# Patient Record
Sex: Female | Born: 1990 | Race: Black or African American | Hispanic: No | Marital: Single | Smoking: Current some day smoker
Health system: Southern US, Community
[De-identification: ages and names within clinical notes are randomized; demographics above are authoritative.]

## PROBLEM LIST (undated history)

## (undated) ENCOUNTER — Inpatient Hospital Stay (HOSPITAL_COMMUNITY): Payer: Self-pay

## (undated) DIAGNOSIS — R519 Headache, unspecified: Secondary | ICD-10-CM

## (undated) DIAGNOSIS — F329 Major depressive disorder, single episode, unspecified: Secondary | ICD-10-CM

## (undated) DIAGNOSIS — R87629 Unspecified abnormal cytological findings in specimens from vagina: Secondary | ICD-10-CM

## (undated) DIAGNOSIS — F32A Depression, unspecified: Secondary | ICD-10-CM

## (undated) DIAGNOSIS — O2340 Unspecified infection of urinary tract in pregnancy, unspecified trimester: Secondary | ICD-10-CM

## (undated) HISTORY — DX: Headache, unspecified: R51.9

## (undated) HISTORY — PX: NO PAST SURGERIES: SHX2092

## (undated) HISTORY — DX: Unspecified infection of urinary tract in pregnancy, unspecified trimester: O23.40

## (undated) HISTORY — DX: Unspecified abnormal cytological findings in specimens from vagina: R87.629

---
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
## 2009-05-04 ENCOUNTER — Emergency Department (HOSPITAL_COMMUNITY): Admission: EM | Admit: 2009-05-04 | Discharge: 2009-05-04 | Payer: Self-pay | Admitting: Emergency Medicine

## 2009-05-15 ENCOUNTER — Ambulatory Visit: Payer: Self-pay | Admitting: Family

## 2009-05-15 ENCOUNTER — Inpatient Hospital Stay (HOSPITAL_COMMUNITY): Admission: AD | Admit: 2009-05-15 | Discharge: 2009-05-15 | Payer: Self-pay | Admitting: Obstetrics and Gynecology

## 2009-05-23 ENCOUNTER — Ambulatory Visit: Payer: Self-pay | Admitting: Obstetrics and Gynecology

## 2009-05-23 ENCOUNTER — Encounter: Payer: Self-pay | Admitting: Physician Assistant

## 2009-05-23 LAB — CONVERTED CEMR LAB
Hgb A: 97.3 % (ref 96.8–97.8)
Hgb S Quant: 0 % (ref 0.0–0.0)

## 2009-06-14 ENCOUNTER — Ambulatory Visit: Payer: Self-pay | Admitting: Obstetrics & Gynecology

## 2009-06-28 ENCOUNTER — Encounter: Payer: Self-pay | Admitting: Obstetrics & Gynecology

## 2009-06-28 ENCOUNTER — Ambulatory Visit: Payer: Self-pay | Admitting: Obstetrics and Gynecology

## 2009-06-29 ENCOUNTER — Encounter: Payer: Self-pay | Admitting: Obstetrics & Gynecology

## 2009-06-29 LAB — CONVERTED CEMR LAB
Chlamydia, DNA Probe: NEGATIVE
GC Probe Amp, Genital: NEGATIVE

## 2009-07-05 ENCOUNTER — Ambulatory Visit: Payer: Self-pay | Admitting: Obstetrics & Gynecology

## 2009-07-06 ENCOUNTER — Ambulatory Visit: Payer: Self-pay | Admitting: Family Medicine

## 2009-07-06 ENCOUNTER — Inpatient Hospital Stay (HOSPITAL_COMMUNITY): Admission: AD | Admit: 2009-07-06 | Discharge: 2009-07-06 | Payer: Self-pay | Admitting: Obstetrics & Gynecology

## 2009-07-12 ENCOUNTER — Encounter: Payer: Self-pay | Admitting: Obstetrics & Gynecology

## 2009-07-12 ENCOUNTER — Ambulatory Visit: Payer: Self-pay | Admitting: Obstetrics and Gynecology

## 2009-07-19 ENCOUNTER — Ambulatory Visit: Payer: Self-pay | Admitting: Family Medicine

## 2009-07-20 ENCOUNTER — Inpatient Hospital Stay (HOSPITAL_COMMUNITY): Admission: AD | Admit: 2009-07-20 | Discharge: 2009-07-20 | Payer: Self-pay | Admitting: Obstetrics & Gynecology

## 2009-07-20 ENCOUNTER — Ambulatory Visit: Payer: Self-pay | Admitting: Advanced Practice Midwife

## 2009-07-23 ENCOUNTER — Ambulatory Visit: Payer: Self-pay | Admitting: Obstetrics and Gynecology

## 2009-07-23 ENCOUNTER — Inpatient Hospital Stay (HOSPITAL_COMMUNITY): Admission: AD | Admit: 2009-07-23 | Discharge: 2009-07-25 | Payer: Self-pay | Admitting: Obstetrics & Gynecology

## 2009-08-23 ENCOUNTER — Ambulatory Visit: Payer: Self-pay | Admitting: Obstetrics & Gynecology

## 2009-11-12 ENCOUNTER — Emergency Department (HOSPITAL_COMMUNITY): Admission: EM | Admit: 2009-11-12 | Discharge: 2009-11-12 | Payer: Self-pay | Admitting: Emergency Medicine

## 2010-05-23 LAB — URINE CULTURE
Colony Count: 25000
Culture  Setup Time: 201109060212

## 2010-05-23 LAB — URINE MICROSCOPIC-ADD ON

## 2010-05-23 LAB — URINALYSIS, ROUTINE W REFLEX MICROSCOPIC
Ketones, ur: NEGATIVE mg/dL
Nitrite: NEGATIVE
Protein, ur: NEGATIVE mg/dL

## 2010-05-27 LAB — CBC
HCT: 34.8 % — ABNORMAL LOW (ref 36.0–46.0)
Hemoglobin: 11.6 g/dL — ABNORMAL LOW (ref 12.0–15.0)
MCHC: 33.3 g/dL (ref 30.0–36.0)
MCV: 85.7 fL (ref 78.0–100.0)
Platelets: 268 K/uL (ref 150–400)
RBC: 4.06 MIL/uL (ref 3.87–5.11)
RDW: 14.1 % (ref 11.5–15.5)
WBC: 9.1 K/uL (ref 4.0–10.5)

## 2010-05-27 LAB — URINALYSIS, ROUTINE W REFLEX MICROSCOPIC
Glucose, UA: NEGATIVE mg/dL
Hgb urine dipstick: NEGATIVE
Ketones, ur: 15 mg/dL — AB
Protein, ur: NEGATIVE mg/dL
Specific Gravity, Urine: >1.03 — ABNORMAL HIGH (ref 1.005–1.030)
Urobilinogen, UA: 1 mg/dL (ref 0.0–1.0)
pH: 6 (ref 5.0–8.0)

## 2010-05-27 LAB — RAPID URINE DRUG SCREEN, HOSP PERFORMED: Cocaine: NOT DETECTED

## 2010-05-28 LAB — POCT URINALYSIS DIP (DEVICE)
Bilirubin Urine: NEGATIVE
Bilirubin Urine: NEGATIVE
Glucose, UA: NEGATIVE mg/dL
Hgb urine dipstick: NEGATIVE
Hgb urine dipstick: NEGATIVE
Hgb urine dipstick: NEGATIVE
Ketones, ur: NEGATIVE mg/dL
Ketones, ur: NEGATIVE mg/dL
Nitrite: NEGATIVE
Protein, ur: 30 mg/dL — AB
Protein, ur: 30 mg/dL — AB
Specific Gravity, Urine: 1.02 (ref 1.005–1.030)
Specific Gravity, Urine: >=1.03 (ref 1.005–1.030)
Urobilinogen, UA: 1 mg/dL (ref 0.0–1.0)
Urobilinogen, UA: 1 mg/dL (ref 0.0–1.0)
Urobilinogen, UA: 2 mg/dL — ABNORMAL HIGH (ref 0.0–1.0)
pH: 7 (ref 5.0–8.0)
pH: 7.5 (ref 5.0–8.0)

## 2010-05-29 LAB — POCT URINALYSIS DIP (DEVICE)
Hgb urine dipstick: NEGATIVE
Ketones, ur: NEGATIVE mg/dL
Urobilinogen, UA: 0.2 mg/dL (ref 0.0–1.0)
pH: 5.5 (ref 5.0–8.0)

## 2010-06-03 LAB — POCT URINALYSIS DIP (DEVICE)
Ketones, ur: NEGATIVE mg/dL
Nitrite: NEGATIVE
Specific Gravity, Urine: >=1.03 (ref 1.005–1.030)
pH: 6 (ref 5.0–8.0)

## 2010-06-03 LAB — URINE CULTURE

## 2010-06-03 LAB — CBC
HCT: 37.9 % (ref 36.0–46.0)
Hemoglobin: 12.5 g/dL (ref 12.0–15.0)
MCV: 90.3 fL (ref 78.0–100.0)
Platelets: 278 10*3/uL (ref 150–400)
RBC: 4.2 MIL/uL (ref 3.87–5.11)
WBC: 6.9 10*3/uL (ref 4.0–10.5)

## 2010-06-03 LAB — GLUCOSE, CAPILLARY: Glucose-Capillary: 48 mg/dL — ABNORMAL LOW (ref 70–99)

## 2010-06-03 LAB — URINALYSIS, ROUTINE W REFLEX MICROSCOPIC
Glucose, UA: NEGATIVE mg/dL
Nitrite: NEGATIVE
Specific Gravity, Urine: 1.01 (ref 1.005–1.030)
pH: 7 (ref 5.0–8.0)

## 2010-06-03 LAB — DIFFERENTIAL
Basophils Absolute: 0 10*3/uL (ref 0.0–0.1)
Basophils Relative: 0 % (ref 0–1)
Eosinophils Relative: 4 % (ref 0–5)

## 2010-06-03 LAB — URINE MICROSCOPIC-ADD ON

## 2010-06-03 LAB — WET PREP, GENITAL
Clue Cells Wet Prep HPF POC: NONE SEEN
Yeast Wet Prep HPF POC: NONE SEEN

## 2010-06-03 LAB — GC/CHLAMYDIA PROBE AMP, GENITAL: GC Probe Amp, Genital: NEGATIVE

## 2010-06-03 LAB — HEPATITIS B SURFACE ANTIGEN: Hepatitis B Surface Ag: NEGATIVE

## 2010-06-14 LAB — URINALYSIS, ROUTINE W REFLEX MICROSCOPIC
Glucose, UA: NEGATIVE mg/dL
Ketones, ur: 15 mg/dL — AB
Protein, ur: NEGATIVE mg/dL
Urobilinogen, UA: 1 mg/dL (ref 0.0–1.0)

## 2010-06-14 LAB — URINE CULTURE

## 2010-06-14 LAB — URINE MICROSCOPIC-ADD ON

## 2010-06-14 LAB — BASIC METABOLIC PANEL
Calcium: 9.2 mg/dL (ref 8.4–10.5)
Chloride: 107 mEq/L (ref 96–112)
Creatinine, Ser: 0.62 mg/dL (ref 0.4–1.2)
GFR calc Af Amer: >60 mL/min (ref >60)

## 2010-06-14 LAB — DIFFERENTIAL
Lymphocytes Relative: 12 % (ref 12–46)
Lymphs Abs: 1.3 10*3/uL (ref 0.7–4.0)
Monocytes Relative: 4 % (ref 3–12)
Neutro Abs: 9.6 10*3/uL — ABNORMAL HIGH (ref 1.7–7.7)
Neutrophils Relative %: 84 % — ABNORMAL HIGH (ref 43–77)

## 2010-06-14 LAB — CBC
RBC: 4.68 MIL/uL (ref 3.87–5.11)
WBC: 11.5 10*3/uL — ABNORMAL HIGH (ref 4.0–10.5)

## 2010-06-14 LAB — GC/CHLAMYDIA PROBE AMP, GENITAL
Chlamydia, DNA Probe: POSITIVE — AB
GC Probe Amp, Genital: NEGATIVE

## 2010-06-14 LAB — ABO/RH: ABO/RH(D): A POS

## 2010-06-16 LAB — CBC
HCT: 37 % (ref 36.0–46.0)
Hemoglobin: 13 g/dL (ref 12.0–15.0)
MCHC: 34.6 g/dL (ref 30.0–36.0)
MCHC: 35.2 g/dL (ref 30.0–36.0)
MCV: 91 fL (ref 78.0–100.0)
RBC: 3.63 MIL/uL — ABNORMAL LOW (ref 3.87–5.11)
RDW: 13.4 % (ref 11.5–15.5)

## 2010-06-16 LAB — CCBB MATERNAL DONOR DRAW

## 2010-06-19 LAB — WET PREP, GENITAL

## 2010-07-08 ENCOUNTER — Inpatient Hospital Stay (INDEPENDENT_AMBULATORY_CARE_PROVIDER_SITE_OTHER)
Admission: RE | Admit: 2010-07-08 | Discharge: 2010-07-08 | Disposition: A | Discharge status: Home / Self Care | Payer: Medicaid Other | Source: Ambulatory Visit | Attending: Family Medicine | Admitting: Family Medicine

## 2010-07-08 DIAGNOSIS — N390 Urinary tract infection, site not specified: Secondary | ICD-10-CM

## 2010-07-08 LAB — POCT URINALYSIS DIP (DEVICE)
Nitrite: POSITIVE — AB
Protein, ur: 100 mg/dL — AB

## 2010-07-08 LAB — POCT PREGNANCY, URINE: Preg Test, Ur: NEGATIVE

## 2010-07-09 LAB — GC/CHLAMYDIA PROBE AMP, URINE
Chlamydia, Swab/Urine, PCR: POSITIVE — AB
GC Probe Amp, Urine: NEGATIVE

## 2010-07-10 LAB — URINE CULTURE: Culture  Setup Time: 201204301530

## 2010-10-05 ENCOUNTER — Emergency Department (HOSPITAL_COMMUNITY)
Admission: EM | Admit: 2010-10-05 | Discharge: 2010-10-05 | Disposition: A | Discharge status: Home / Self Care | Payer: Medicaid Other | Attending: Emergency Medicine | Admitting: Emergency Medicine

## 2010-10-05 DIAGNOSIS — R55 Syncope and collapse: Secondary | ICD-10-CM | POA: Insufficient documentation

## 2010-10-05 DIAGNOSIS — R110 Nausea: Secondary | ICD-10-CM | POA: Insufficient documentation

## 2010-10-05 LAB — POCT I-STAT, CHEM 8
Creatinine, Ser: 0.8 mg/dL (ref 0.50–1.10)
HCT: 41 % (ref 36.0–46.0)
Hemoglobin: 13.9 g/dL (ref 12.0–15.0)
Potassium: 3.7 mEq/L (ref 3.5–5.1)
Sodium: 141 mEq/L (ref 135–145)

## 2010-10-05 LAB — HCG, SERUM, QUALITATIVE: Preg, Serum: NEGATIVE

## 2010-12-02 LAB — WET PREP, GENITAL: Clue Cells Wet Prep HPF POC: NONE SEEN

## 2010-12-02 LAB — POCT URINALYSIS DIP (DEVICE)
Glucose, UA: NEGATIVE
Hgb urine dipstick: NEGATIVE
Nitrite: NEGATIVE
Operator id: 116491
Specific Gravity, Urine: 1.025
Urobilinogen, UA: 1

## 2010-12-02 LAB — URINALYSIS, ROUTINE W REFLEX MICROSCOPIC
Ketones, ur: NEGATIVE
Nitrite: NEGATIVE
Protein, ur: NEGATIVE
Urobilinogen, UA: 1

## 2010-12-02 LAB — URINE MICROSCOPIC-ADD ON

## 2010-12-02 LAB — GC/CHLAMYDIA PROBE AMP, GENITAL: Chlamydia, DNA Probe: POSITIVE — AB

## 2010-12-02 LAB — URINE CULTURE: Colony Count: 80000

## 2010-12-06 LAB — CULTURE, ROUTINE-ABSCESS

## 2010-12-13 LAB — HCG, QUANTITATIVE, PREGNANCY: hCG, Beta Chain, Quant, S: 35387 m[IU]/mL — ABNORMAL HIGH (ref <5)

## 2010-12-13 LAB — URINALYSIS, ROUTINE W REFLEX MICROSCOPIC
Bilirubin Urine: NEGATIVE
Glucose, UA: NEGATIVE mg/dL
Hgb urine dipstick: NEGATIVE
Specific Gravity, Urine: 1.022 (ref 1.005–1.030)
Urobilinogen, UA: 0.2 mg/dL (ref 0.0–1.0)

## 2010-12-13 LAB — URINE MICROSCOPIC-ADD ON

## 2011-02-08 ENCOUNTER — Emergency Department (HOSPITAL_COMMUNITY)
Admission: EM | Admit: 2011-02-08 | Discharge: 2011-02-09 | Disposition: A | Discharge status: Home / Self Care | Payer: Medicaid Other | Attending: Emergency Medicine | Admitting: Emergency Medicine

## 2011-02-08 ENCOUNTER — Encounter: Payer: Self-pay | Admitting: *Deleted

## 2011-02-08 DIAGNOSIS — R109 Unspecified abdominal pain: Secondary | ICD-10-CM | POA: Insufficient documentation

## 2011-02-08 DIAGNOSIS — A64 Unspecified sexually transmitted disease: Secondary | ICD-10-CM

## 2011-02-08 DIAGNOSIS — N898 Other specified noninflammatory disorders of vagina: Secondary | ICD-10-CM | POA: Insufficient documentation

## 2011-02-08 DIAGNOSIS — R110 Nausea: Secondary | ICD-10-CM | POA: Insufficient documentation

## 2011-02-08 NOTE — ED Notes (Signed)
Patient with abdominal pain for about three weeks.  Pain is located on her low abdominal area

## 2011-02-09 LAB — URINALYSIS, ROUTINE W REFLEX MICROSCOPIC
Bilirubin Urine: NEGATIVE
Ketones, ur: NEGATIVE mg/dL
Nitrite: POSITIVE — AB
Protein, ur: NEGATIVE mg/dL
Urobilinogen, UA: 1 mg/dL (ref 0.0–1.0)

## 2011-02-09 LAB — URINE MICROSCOPIC-ADD ON

## 2011-02-09 LAB — WET PREP, GENITAL: Trich, Wet Prep: NONE SEEN

## 2011-02-09 MED ORDER — AZITHROMYCIN 250 MG PO TABS
1000.0000 mg | ORAL_TABLET | Freq: Once | ORAL | Status: AC
  Administered 2011-02-09: 1000 mg via ORAL
  Filled 2011-02-09: qty 4

## 2011-02-09 MED ORDER — DOXYCYCLINE HYCLATE 100 MG PO CAPS: 100.0000 mg | ORAL_CAPSULE | Freq: Two times a day (BID) | ORAL | Status: AC

## 2011-02-09 MED ORDER — CIPROFLOXACIN HCL 500 MG PO TABS
500.0000 mg | ORAL_TABLET | Freq: Once | ORAL | Status: AC
  Administered 2011-02-09: 500 mg via ORAL
  Filled 2011-02-09: qty 1

## 2011-02-09 NOTE — ED Provider Notes (Signed)
Medical screening examination/treatment/procedure(s) were performed by non-physician practitioner and as supervising physician I was immediately available for consultation/collaboration.  Santresa Levett, MD 02/09/11 0711 

## 2011-02-09 NOTE — ED Notes (Signed)
Pelvic set up and Conley Rolls made aware.

## 2011-02-09 NOTE — ED Provider Notes (Signed)
History     CSN: [id number] Arrival date & time: 02/08/2011 11:30 PM   First MD Initiated Contact with Patient 02/09/11 0309      Chief Complaint  Patient presents with  . Abdominal Pain    (Consider location/radiation/quality/duration/timing/severity/associated sxs/prior treatment) HPI Comments: Ms. Gitto is complaining of generalized low abdominal pain for greater than 2 weeks.  Denies vaginal discharge does state she is nauseated.  No vomiting.  No change in bowel habits.  Has not taken any over-the-counter medication.  His pain is not relieved by rest and are exacerbated by activity.  Last menstrual period approximately 4 weeks ago  Patient is a 20 y.o. female presenting with abdominal pain. The history is provided by the patient.  Abdominal Pain The primary symptoms of the illness include abdominal pain and nausea. The primary symptoms of the illness do not include fever, shortness of breath, vomiting, diarrhea, dysuria, vaginal discharge or vaginal bleeding. The current episode started more than 2 days ago. The onset of the illness was gradual. The problem has not changed since onset. The abdominal pain began more than 2 days ago. The pain came on gradually. The abdominal pain has been unchanged since its onset. The abdominal pain is generalized. The abdominal pain does not radiate. The severity of the abdominal pain is 7/10. The abdominal pain is relieved by nothing. The abdominal pain is exacerbated by movement.  Nausea began more than 1 week ago. The nausea is associated with eating. The nausea is exacerbated by food.  The illness is associated with eating. The patient states that she believes she is currently not pregnant. The patient has not had a change in bowel habit. Symptoms associated with the illness do not include chills, anorexia, heartburn, constipation, urgency, hematuria or frequency.    History reviewed. No pertinent past medical history.  History reviewed. No  pertinent past surgical history.  History reviewed. No pertinent family history.  History  Substance Use Topics  . Smoking status: Current Everyday Smoker    Types: Cigarettes  . Smokeless tobacco: Not on file  . Alcohol Use: Yes    OB History    Grav Para Term Preterm Abortions TAB SAB Ect Mult Living                  Review of Systems  Constitutional: Negative for fever and chills.  HENT: Negative.   Eyes: Negative.   Respiratory: Negative for shortness of breath.   Cardiovascular: Negative.   Gastrointestinal: Positive for nausea and abdominal pain. Negative for heartburn, vomiting, diarrhea, constipation and anorexia.  Genitourinary: Negative for dysuria, urgency, frequency, hematuria, vaginal bleeding, vaginal discharge and vaginal pain.  Musculoskeletal: Negative.   Skin: Negative.   Neurological: Negative for dizziness and weakness.  Hematological: Negative.   Psychiatric/Behavioral: Negative.     Allergies  Penicillins  Home Medications   Current Outpatient Rx  Name Route Sig Dispense Refill  . DOXYCYCLINE HYCLATE 100 MG PO CAPS Oral Take 1 capsule (100 mg total) by mouth 2 (two) times daily. 20 capsule 0    BP 107/65  Pulse 88  Temp(Src) 97.6 F (36.4 C) (Oral)  Resp 14  SpO2 99%  Physical Exam  Constitutional: She appears well-developed.  Eyes: Pupils are equal, round, and reactive to light.  Neck: Normal range of motion.  Cardiovascular: Normal rate.   Pulmonary/Chest: Effort normal.  Abdominal: Soft. Bowel sounds are normal.  Genitourinary: Pelvic exam was performed with patient supine. No erythema or tenderness around the vagina.  Vaginal discharge found.  Musculoskeletal: Normal range of motion.  Neurological: She is alert.  Skin: Skin is warm.    ED Course  Procedures (including critical care time)  Labs Reviewed  URINALYSIS, ROUTINE W REFLEX MICROSCOPIC - Abnormal; Notable for the following:    APPearance CLOUDY (*)    Nitrite  POSITIVE (*)    Leukocytes, UA SMALL (*)    All other components within normal limits  URINE MICROSCOPIC-ADD ON - Abnormal; Notable for the following:    Squamous Epithelial / LPF MANY (*)    Bacteria, UA MANY (*)    All other components within normal limits  WET PREP, GENITAL - Abnormal; Notable for the following:    Yeast, Wet Prep FEW YEAST (*)    Clue Cells, Wet Prep FEW (*)    WBC, Wet Prep HPF POC FEW (*)    All other components within normal limits  POCT PREGNANCY, URINE  POCT PREGNANCY, URINE  GC/CHLAMYDIA PROBE AMP, GENITAL   No results found.   1. STD (female)       MDM  After vaginal exam.  This is most likely STD to to the amount of discharge and odor.  Minimally tender cervical motion, no adnexal fullness or tenderness.        Arman Filter, NP 02/09/11 0602  Arman Filter, NP 02/09/11 1610  Arman Filter, NP 02/09/11 9604  Arman Filter, NP 02/09/11 [phone]

## 2011-02-15 NOTE — ED Notes (Signed)
Unable to contact patient by phone with positive lab results, numbers provided by patient not in service. Letter sent.

## 2011-03-11 NOTE — L&D Delivery Note (Signed)
Delivery Note At 12:10 AROM. At 12:19 PM a viable female was delivered via  (Presentation:Vertex ; ROA ).  APGAR:8 ,9 ; weight 6lb 1.7oz.   Placenta status: , .  Cord:  with the following complications: .   Anesthesia: Epidural  Episiotomy: None Lacerations: None Est. Blood Loss (mL): 450  Mom to postpartum.  Baby to nursery-stable.  Governor Specking 03/03/2012, 12:36 PM  I was present for delivery and agree with note above. [facility]

## 2011-05-17 ENCOUNTER — Inpatient Hospital Stay (HOSPITAL_COMMUNITY)
Admission: AD | Admit: 2011-05-17 | Discharge: 2011-05-17 | Disposition: A | Discharge status: Home / Self Care | Payer: Medicaid Other | Source: Ambulatory Visit | Attending: Obstetrics & Gynecology | Admitting: Obstetrics & Gynecology

## 2011-05-17 ENCOUNTER — Encounter (HOSPITAL_COMMUNITY): Payer: Self-pay | Admitting: *Deleted

## 2011-05-17 DIAGNOSIS — B9689 Other specified bacterial agents as the cause of diseases classified elsewhere: Secondary | ICD-10-CM | POA: Insufficient documentation

## 2011-05-17 DIAGNOSIS — A499 Bacterial infection, unspecified: Secondary | ICD-10-CM | POA: Insufficient documentation

## 2011-05-17 DIAGNOSIS — R109 Unspecified abdominal pain: Secondary | ICD-10-CM | POA: Insufficient documentation

## 2011-05-17 DIAGNOSIS — N760 Acute vaginitis: Secondary | ICD-10-CM | POA: Insufficient documentation

## 2011-05-17 HISTORY — DX: Major depressive disorder, single episode, unspecified: F32.9

## 2011-05-17 HISTORY — DX: Depression, unspecified: F32.A

## 2011-05-17 LAB — URINALYSIS, ROUTINE W REFLEX MICROSCOPIC
Glucose, UA: NEGATIVE mg/dL
Ketones, ur: NEGATIVE mg/dL
Nitrite: NEGATIVE
Protein, ur: NEGATIVE mg/dL

## 2011-05-17 LAB — WET PREP, GENITAL

## 2011-05-17 LAB — CBC
HCT: 39.4 % (ref 36.0–46.0)
Hemoglobin: 13.4 g/dL (ref 12.0–15.0)
MCH: 30.2 pg (ref 26.0–34.0)
MCHC: 34 g/dL (ref 30.0–36.0)
MCV: 88.9 fL (ref 78.0–100.0)
Platelets: 250 K/uL (ref 150–400)
RBC: 4.43 MIL/uL (ref 3.87–5.11)
RDW: 13 % (ref 11.5–15.5)
WBC: 6.5 K/uL (ref 4.0–10.5)

## 2011-05-17 LAB — URINE MICROSCOPIC-ADD ON

## 2011-05-17 MED ORDER — METRONIDAZOLE 500 MG PO TABS: 500.0000 mg | ORAL_TABLET | Freq: Two times a day (BID) | ORAL | Status: AC

## 2011-05-17 NOTE — Discharge Instructions (Signed)

## 2011-05-17 NOTE — Progress Notes (Signed)
Easton Rice PA in to see pt 

## 2011-05-17 NOTE — ED Provider Notes (Signed)
Attestation of Attending Supervision of Advanced Practitioner: Evaluation and management procedures were performed by the PA/NP/CNM/OB Fellow under my supervision/collaboration. Chart reviewed, and agree with management and plan.  Papa Piercefield, M.D. 05/17/2011 11:07 PM   

## 2011-05-17 NOTE — Progress Notes (Signed)
Having sharp pain in stomach for a month. Comes and goes every second. Eating a lot.Vomiting. "I think I'm pregnant".

## 2011-05-17 NOTE — ED Provider Notes (Signed)
History   Pt presents today c/o abd pain x 1 month. She also c/o occ nausea. She states she thinks she could be pregnant. Her last episode of intercourse was a couple of hours ago. She denies vag irritation, fever, or any other sx at this time.  Chief Complaint  Patient presents with  . Abdominal Pain  . Emesis   HPI  OB History    Grav Para Term Preterm Abortions TAB SAB Ect Mult Living   3 3 3  0 0 0 0 0 0 3      Past Medical History  Diagnosis Date  . Depression     Past Surgical History  Procedure Date  . No past surgeries     Family History  Problem Relation Age of Onset  . Anesthesia problems Neg Hx   . Hypotension Neg Hx   . Malignant hyperthermia Neg Hx   . Pseudochol deficiency Neg Hx     History  Substance Use Topics  . Smoking status: Current Everyday Smoker    Types: Cigarettes  . Smokeless tobacco: Not on file  . Alcohol Use: Yes    Allergies:  Allergies  Allergen Reactions  . Penicillins Swelling    No prescriptions prior to admission    Review of Systems  Constitutional: Negative for fever and chills.  Eyes: Negative for blurred vision and double vision.  Respiratory: Negative for cough, hemoptysis, sputum production, shortness of breath and wheezing.   Cardiovascular: Negative for chest pain and palpitations.  Gastrointestinal: Positive for nausea and abdominal pain. Negative for vomiting, diarrhea and constipation.  Genitourinary: Negative for dysuria, urgency, frequency and hematuria.  Neurological: Negative for dizziness and headaches.  Psychiatric/Behavioral: Negative for depression and suicidal ideas.   Physical Exam   Blood pressure 100/55, pulse 78, temperature 98.9 F (37.2 C), temperature source Oral, resp. rate 20, height 5\' 2"  (1.575 m), weight 118 lb (53.524 kg).  Physical Exam  Nursing note and vitals reviewed. Constitutional: She is oriented to person, place, and time. She appears well-developed and well-nourished. No  distress.  HENT:  Head: Normocephalic and atraumatic.  Eyes: EOM are normal. Pupils are equal, round, and reactive to light.  GI: Soft. She exhibits no distension and no mass. There is tenderness. There is no rebound and no guarding.  Genitourinary: No bleeding around the vagina. Vaginal discharge found.       Thin, milky vag dc present. Cervix Lg/closed. Uterus NL size and shape with no adnexal masses. Pt non-tender on exam.  Neurological: She is alert and oriented to person, place, and time.  Skin: Skin is warm and dry. She is not diaphoretic.  Psychiatric: She has a normal mood and affect. Her behavior is normal. Judgment and thought content normal.    MAU Course  Procedures  Wet prep and GC/Chlamdyia cultures done.  Results for orders placed during the hospital encounter of 05/17/11 (from the past 72 hour(s))  URINALYSIS, ROUTINE W REFLEX MICROSCOPIC     Status: Abnormal   Collection Time   05/17/11  8:50 PM      Component Value Range Comment   Color, Urine YELLOW  YELLOW     APPearance CLEAR  CLEAR     Specific Gravity, Urine 1.025  1.005 - 1.030     pH 6.0  5.0 - 8.0     Glucose, UA NEGATIVE  NEGATIVE (mg/dL)    Hgb urine dipstick NEGATIVE  NEGATIVE     Bilirubin Urine NEGATIVE  NEGATIVE  Ketones, ur NEGATIVE  NEGATIVE (mg/dL)    Protein, ur NEGATIVE  NEGATIVE (mg/dL)    Urobilinogen, UA 0.2  0.0 - 1.0 (mg/dL)    Nitrite NEGATIVE  NEGATIVE     Leukocytes, UA TRACE (*) NEGATIVE    URINE MICROSCOPIC-ADD ON     Status: Abnormal   Collection Time   05/17/11  8:50 PM      Component Value Range Comment   Squamous Epithelial / LPF FEW (*) RARE     WBC, UA 3-6  <3 (WBC/hpf)    RBC / HPF 7-10  <3 (RBC/hpf)    Bacteria, UA FEW (*) RARE    POCT PREGNANCY, URINE     Status: Normal   Collection Time   05/17/11  8:57 PM      Component Value Range Comment   Preg Test, Ur NEGATIVE  NEGATIVE    CBC     Status: Normal   Collection Time   05/17/11  9:21 PM      Component Value Range  Comment   WBC 6.5  4.0 - 10.5 (K/uL)    RBC 4.43  3.87 - 5.11 (MIL/uL)    Hemoglobin 13.4  12.0 - 15.0 (g/dL)    HCT 40.9  81.1 - 91.4 (%)    MCV 88.9  78.0 - 100.0 (fL)    MCH 30.2  26.0 - 34.0 (pg)    MCHC 34.0  30.0 - 36.0 (g/dL)    RDW 78.2  95.6 - 21.3 (%)    Platelets 250  150 - 400 (K/uL)   WET PREP, GENITAL     Status: Abnormal   Collection Time   05/17/11  9:30 PM      Component Value Range Comment   Yeast Wet Prep HPF POC NONE SEEN  NONE SEEN     Trich, Wet Prep NONE SEEN  NONE SEEN     Clue Cells Wet Prep HPF POC FEW (*) NONE SEEN     WBC, Wet Prep HPF POC FEW (*) NONE SEEN  MODERATE BACTERIA SEEN     Assessment and Plan  BV: will tx with Flagyl. Warned of antabuse reaction.  Abd pain: will await cultures and urine culture. She will f/u with her PCP. Discussed diet, activity, risks, and precautions.  Clinton Gallant. Ceriah Kohler III, DrHSc, MPAS, PA-C  05/17/2011, 9:34 PM   Harmon Pier, PA 05/17/11 2201

## 2011-05-17 NOTE — Progress Notes (Signed)
Henrietta Hoover PA in to see pt and discuss d/c instructions and understanding voiced

## 2011-05-19 LAB — GC/CHLAMYDIA PROBE AMP, GENITAL: Chlamydia, DNA Probe: NEGATIVE

## 2012-02-06 ENCOUNTER — Encounter (HOSPITAL_COMMUNITY): Payer: Self-pay | Admitting: *Deleted

## 2012-02-06 ENCOUNTER — Inpatient Hospital Stay (HOSPITAL_COMMUNITY)
Admission: AD | Admit: 2012-02-06 | Discharge: 2012-02-06 | Disposition: A | Discharge status: Home / Self Care | Payer: Medicaid Other | Source: Ambulatory Visit | Attending: Obstetrics | Admitting: Obstetrics

## 2012-02-06 DIAGNOSIS — O2390 Unspecified genitourinary tract infection in pregnancy, unspecified trimester: Secondary | ICD-10-CM | POA: Insufficient documentation

## 2012-02-06 DIAGNOSIS — O4700 False labor before 37 completed weeks of gestation, unspecified trimester: Secondary | ICD-10-CM

## 2012-02-06 DIAGNOSIS — R109 Unspecified abdominal pain: Secondary | ICD-10-CM | POA: Insufficient documentation

## 2012-02-06 DIAGNOSIS — N760 Acute vaginitis: Secondary | ICD-10-CM | POA: Insufficient documentation

## 2012-02-06 LAB — URINE MICROSCOPIC-ADD ON

## 2012-02-06 LAB — URINALYSIS, ROUTINE W REFLEX MICROSCOPIC
Bilirubin Urine: NEGATIVE
Ketones, ur: NEGATIVE mg/dL
Protein, ur: NEGATIVE mg/dL
Urobilinogen, UA: 0.2 mg/dL (ref 0.0–1.0)

## 2012-02-06 LAB — WET PREP, GENITAL
Trich, Wet Prep: NONE SEEN
Yeast Wet Prep HPF POC: NONE SEEN

## 2012-02-06 LAB — FETAL FIBRONECTIN: Fetal Fibronectin: POSITIVE — AB

## 2012-02-06 MED ORDER — AZITHROMYCIN 250 MG PO TABS
1000.0000 mg | ORAL_TABLET | Freq: Once | ORAL | Status: AC
  Administered 2012-02-06: 1000 mg via ORAL
  Filled 2012-02-06: qty 4

## 2012-02-06 MED ORDER — METRONIDAZOLE 500 MG PO TABS: 500.0000 mg | ORAL_TABLET | Freq: Two times a day (BID) | ORAL | Status: DC

## 2012-02-06 NOTE — MAU Provider Note (Signed)
History     CSN: [id number]  Arrival date and time: 02/06/12 2024   First Provider Initiated Contact with Patient 02/06/12 2057      Chief Complaint  Patient presents with  . Abdominal Pain   HPI Misty Ewing 21 y.o. [redacted]w[redacted]d  Began having lower abdominal pain which goes to her back about 6 pm tonight.  Came for evaluation.  Has not been seen in at Dr. Elsie Ewing office in one month.  Denies any previous history of preterm births.  OB History    Grav Para Term Preterm Abortions TAB SAB Ect Mult Living   4 3 3  0 0 0 0 0 0 3      Past Medical History  Diagnosis Date  . Depression     Past Surgical History  Procedure Date  . No past surgeries     Family History  Problem Relation Age of Onset  . Anesthesia problems Neg Hx   . Hypotension Neg Hx   . Malignant hyperthermia Neg Hx   . Pseudochol deficiency Neg Hx     History  Substance Use Topics  . Smoking status: Current Every Day Smoker    Types: Cigarettes  . Smokeless tobacco: Not on file  . Alcohol Use: Yes    Allergies:  Allergies  Allergen Reactions  . Penicillins Swelling    No prescriptions prior to admission    Review of Systems  Constitutional: Negative for fever.  Gastrointestinal: Positive for abdominal pain. Negative for nausea, vomiting, diarrhea and constipation.  Genitourinary:       No vaginal discharge. No vaginal bleeding. No dysuria.  Musculoskeletal: Positive for back pain.   Physical Exam   Blood pressure 97/54, pulse 101, temperature 98.4 F (36.9 C), temperature source Oral, resp. rate 18.  Physical Exam  Nursing note and vitals reviewed. Constitutional: She is oriented to person, place, and time. She appears well-developed and well-nourished.  HENT:  Head: Normocephalic.  Eyes: EOM are normal.  Neck: Neck supple.  GI: Soft. There is tenderness. There is no rebound and no guarding.       Lower midline tenderness FHT reassuring.  Has periodic uterine  irritability and then has stronger contractions 4-5 min apart.  Genitourinary:       Speculum exam: Vulva - negative Vagina - Small amount of frothy discharge, no odor Cervix - No contact bleeding Bimanual exam: Cervix 1 cm, thick, vertex at -2 station GC/Chlam, wet prep done Chaperone present for exam. No CVA tenderness  Musculoskeletal: Normal range of motion.  Neurological: She is alert and oriented to person, place, and time.  Skin: Skin is warm and dry.  Psychiatric: She has a normal mood and affect.    MAU Course  Procedures Results for orders placed during the hospital encounter of 02/06/12 (from the past 24 hour(s))  URINALYSIS, ROUTINE W REFLEX MICROSCOPIC     Status: Abnormal   Collection Time   02/06/12  8:24 PM      Component Value Range   Color, Urine YELLOW  YELLOW   APPearance CLOUDY (*) CLEAR   Specific Gravity, Urine <1.005 (*) 1.005 - 1.030   pH 6.5  5.0 - 8.0   Glucose, UA NEGATIVE  NEGATIVE mg/dL   Hgb urine dipstick TRACE (*) NEGATIVE   Bilirubin Urine NEGATIVE  NEGATIVE   Ketones, ur NEGATIVE  NEGATIVE mg/dL   Protein, ur NEGATIVE  NEGATIVE mg/dL   Urobilinogen, UA 0.2  0.0 - 1.0 mg/dL   Nitrite NEGATIVE  NEGATIVE   Leukocytes, UA LARGE (*) NEGATIVE  URINE MICROSCOPIC-ADD ON     Status: Abnormal   Collection Time   02/06/12  8:24 PM      Component Value Range   Squamous Epithelial / LPF MANY (*) RARE   WBC, UA 21-50  <3 WBC/hpf   RBC / HPF 7-10  <3 RBC/hpf   Bacteria, UA FEW (*) RARE  FETAL FIBRONECTIN     Status: Abnormal   Collection Time   02/06/12  9:25 PM      Component Value Range   Fetal Fibronectin POSITIVE (*) NEGATIVE  WET PREP, GENITAL     Status: Abnormal   Collection Time   02/06/12  9:25 PM      Component Value Range   Yeast Wet Prep HPF POC NONE SEEN  NONE SEEN   Trich, Wet Prep NONE SEEN  NONE SEEN   Clue Cells Wet Prep HPF POC FEW (*) NONE SEEN   WBC, Wet Prep HPF POC FEW (*) NONE SEEN    MDM Urine culture  pending 1225  Consult with Dr. Clearance Ewing re: plan of care - Rocephin IM, zithromax PO and Rx for Flagyl PO    Assessment and Plan  Threatened preterm labor BV  Plan Rx Rocephin 250 mg not given due to severe Penicillin reaction as a child with throat and eyes swelling - had to go to the ER.  Dr. Clearance Ewing notified.  Zithromax 1 gm po single dose now Rx Flagyl 500 mg PO BID x 7 days (#14) no refills Follow up with Dr. Gaynell Ewing on Monday Return if your contractions worsen. Take your medication as prescribed.  Misty Ewing 02/06/2012, 9:28 PM

## 2012-02-06 NOTE — MAU Note (Signed)
Pt presents with abd pain x 1 hr.  Pt also with lower abd cramping.  + FM, denies, contractions.  Pt has not taken anything for pain.  Pain 8 on 0-10 scale.

## 2012-02-08 LAB — GC/CHLAMYDIA PROBE AMP
CT Probe RNA: NEGATIVE
GC Probe RNA: NEGATIVE

## 2012-02-08 LAB — URINE CULTURE

## 2012-03-03 ENCOUNTER — Inpatient Hospital Stay (HOSPITAL_COMMUNITY)
Admission: AD | Admit: 2012-03-03 | Discharge: 2012-03-05 | DRG: 775 | Disposition: A | Discharge status: Home / Self Care | Payer: Medicaid Other | Source: Ambulatory Visit | Attending: Obstetrics and Gynecology | Admitting: Obstetrics and Gynecology

## 2012-03-03 ENCOUNTER — Encounter (HOSPITAL_COMMUNITY): Payer: Self-pay | Admitting: Anesthesiology

## 2012-03-03 ENCOUNTER — Encounter (HOSPITAL_COMMUNITY): Payer: Self-pay

## 2012-03-03 ENCOUNTER — Inpatient Hospital Stay (HOSPITAL_COMMUNITY): Payer: Medicaid Other

## 2012-03-03 ENCOUNTER — Inpatient Hospital Stay (HOSPITAL_COMMUNITY): Payer: Medicaid Other | Admitting: Anesthesiology

## 2012-03-03 DIAGNOSIS — O0930 Supervision of pregnancy with insufficient antenatal care, unspecified trimester: Secondary | ICD-10-CM

## 2012-03-03 LAB — CBC
Hemoglobin: 12 g/dL (ref 12.0–15.0)
Platelets: 248 10*3/uL (ref 150–400)
RBC: 3.87 MIL/uL (ref 3.87–5.11)
WBC: 6.6 10*3/uL (ref 4.0–10.5)

## 2012-03-03 LAB — URINALYSIS, ROUTINE W REFLEX MICROSCOPIC
Nitrite: POSITIVE — AB
Protein, ur: 30 mg/dL — AB
Specific Gravity, Urine: >1.03 — ABNORMAL HIGH (ref 1.005–1.030)
Urobilinogen, UA: 1 mg/dL (ref 0.0–1.0)

## 2012-03-03 LAB — URINE MICROSCOPIC-ADD ON

## 2012-03-03 LAB — RPR: RPR Ser Ql: NONREACTIVE

## 2012-03-03 MED ORDER — FENTANYL 2.5 MCG/ML BUPIVACAINE 1/10 % EPIDURAL INFUSION (WH - ANES)
INTRAMUSCULAR | Status: AC
  Filled 2012-03-03: qty 125

## 2012-03-03 MED ORDER — BENZOCAINE-MENTHOL 20-0.5 % EX AERO: 1.0000 "application " | INHALATION_SPRAY | CUTANEOUS | Status: DC | PRN

## 2012-03-03 MED ORDER — MAGNESIUM SULFATE 40 G IN LACTATED RINGERS - SIMPLE
2.0000 g/h | INTRAVENOUS | Status: DC
  Administered 2012-03-03: 2 g/h via INTRAVENOUS
  Filled 2012-03-03: qty 500

## 2012-03-03 MED ORDER — CITRIC ACID-SODIUM CITRATE 334-500 MG/5ML PO SOLN: 30.0000 mL | ORAL | Status: DC | PRN

## 2012-03-03 MED ORDER — LIDOCAINE HCL (PF) 1 % IJ SOLN: 30.0000 mL | INTRAMUSCULAR | Status: DC | PRN

## 2012-03-03 MED ORDER — ONDANSETRON HCL 4 MG/2ML IJ SOLN: 4.0000 mg | Freq: Four times a day (QID) | INTRAMUSCULAR | Status: DC | PRN

## 2012-03-03 MED ORDER — BETAMETHASONE SOD PHOS & ACET 6 (3-3) MG/ML IJ SUSP
12.0000 mg | Freq: Once | INTRAMUSCULAR | Status: AC
  Administered 2012-03-03: 12 mg via INTRAMUSCULAR
  Filled 2012-03-03: qty 2

## 2012-03-03 MED ORDER — PHENYLEPHRINE 40 MCG/ML (10ML) SYRINGE FOR IV PUSH (FOR BLOOD PRESSURE SUPPORT): 80.0000 ug | PREFILLED_SYRINGE | INTRAVENOUS | Status: DC | PRN

## 2012-03-03 MED ORDER — LACTATED RINGERS IV SOLN: INTRAVENOUS | Status: DC

## 2012-03-03 MED ORDER — ACETAMINOPHEN 325 MG PO TABS: 650.0000 mg | ORAL_TABLET | ORAL | Status: DC | PRN

## 2012-03-03 MED ORDER — FENTANYL 2.5 MCG/ML BUPIVACAINE 1/10 % EPIDURAL INFUSION (WH - ANES)
14.0000 mL/h | INTRAMUSCULAR | Status: DC
  Administered 2012-03-03: 12 mL/h via EPIDURAL

## 2012-03-03 MED ORDER — LACTATED RINGERS IV SOLN
500.0000 mL | INTRAVENOUS | Status: DC | PRN
  Administered 2012-03-03: 500 mL via INTRAVENOUS

## 2012-03-03 MED ORDER — PHENYLEPHRINE 40 MCG/ML (10ML) SYRINGE FOR IV PUSH (FOR BLOOD PRESSURE SUPPORT)
PREFILLED_SYRINGE | INTRAVENOUS | Status: AC
  Filled 2012-03-03: qty 5

## 2012-03-03 MED ORDER — MAGNESIUM SULFATE 40 G IN LACTATED RINGERS - SIMPLE: 2.0000 g/h | INTRAVENOUS | Status: DC

## 2012-03-03 MED ORDER — LACTATED RINGERS IV SOLN: 500.0000 mL | Freq: Once | INTRAVENOUS | Status: DC

## 2012-03-03 MED ORDER — TETANUS-DIPHTH-ACELL PERTUSSIS 5-2.5-18.5 LF-MCG/0.5 IM SUSP
0.5000 mL | Freq: Once | INTRAMUSCULAR | Status: AC
  Administered 2012-03-04: 0.5 mL via INTRAMUSCULAR

## 2012-03-03 MED ORDER — EPHEDRINE 5 MG/ML INJ
INTRAVENOUS | Status: AC
  Filled 2012-03-03: qty 4

## 2012-03-03 MED ORDER — IBUPROFEN 600 MG PO TABS: 600.0000 mg | ORAL_TABLET | Freq: Four times a day (QID) | ORAL | Status: DC | PRN

## 2012-03-03 MED ORDER — LANOLIN HYDROUS EX OINT: TOPICAL_OINTMENT | CUTANEOUS | Status: DC | PRN

## 2012-03-03 MED ORDER — WITCH HAZEL-GLYCERIN EX PADS: 1.0000 "application " | MEDICATED_PAD | CUTANEOUS | Status: DC | PRN

## 2012-03-03 MED ORDER — DIPHENHYDRAMINE HCL 50 MG/ML IJ SOLN: 12.5000 mg | INTRAMUSCULAR | Status: DC | PRN

## 2012-03-03 MED ORDER — PRENATAL MULTIVITAMIN CH
1.0000 | ORAL_TABLET | Freq: Every day | ORAL | Status: DC
  Administered 2012-03-05: 1 via ORAL
  Filled 2012-03-03 (×3): qty 1

## 2012-03-03 MED ORDER — OXYTOCIN BOLUS FROM INFUSION: 500.0000 mL | INTRAVENOUS | Status: DC

## 2012-03-03 MED ORDER — DIPHENHYDRAMINE HCL 25 MG PO CAPS
25.0000 mg | ORAL_CAPSULE | Freq: Four times a day (QID) | ORAL | Status: DC | PRN
  Administered 2012-03-03: 25 mg via ORAL
  Filled 2012-03-03: qty 1

## 2012-03-03 MED ORDER — VANCOMYCIN HCL IN DEXTROSE 1-5 GM/200ML-% IV SOLN
1000.0000 mg | Freq: Two times a day (BID) | INTRAVENOUS | Status: DC
Start: 2012-03-03 — End: 2012-03-03
  Administered 2012-03-03: 1000 mg via INTRAVENOUS
  Filled 2012-03-03 (×2): qty 200

## 2012-03-03 MED ORDER — MAGNESIUM SULFATE BOLUS VIA INFUSION
4.0000 g | Freq: Once | INTRAVENOUS | Status: AC
  Administered 2012-03-03: 4 g via INTRAVENOUS
  Filled 2012-03-03: qty 500

## 2012-03-03 MED ORDER — IBUPROFEN 600 MG PO TABS
600.0000 mg | ORAL_TABLET | Freq: Four times a day (QID) | ORAL | Status: DC
  Administered 2012-03-03 – 2012-03-05 (×2): 600 mg via ORAL
  Filled 2012-03-03 (×6): qty 1

## 2012-03-03 MED ORDER — OXYCODONE-ACETAMINOPHEN 5-325 MG PO TABS: 1.0000 | ORAL_TABLET | ORAL | Status: DC | PRN

## 2012-03-03 MED ORDER — MAGNESIUM SULFATE 40 MG/ML IJ SOLN: 2.0000 g | Freq: Once | INTRAMUSCULAR | Status: DC

## 2012-03-03 MED ORDER — ONDANSETRON HCL 4 MG/2ML IJ SOLN: 4.0000 mg | INTRAMUSCULAR | Status: DC | PRN

## 2012-03-03 MED ORDER — OXYCODONE-ACETAMINOPHEN 5-325 MG PO TABS
1.0000 | ORAL_TABLET | ORAL | Status: DC | PRN
  Administered 2012-03-03 – 2012-03-05 (×4): 1 via ORAL
  Filled 2012-03-03 (×4): qty 1

## 2012-03-03 MED ORDER — SIMETHICONE 80 MG PO CHEW: 80.0000 mg | CHEWABLE_TABLET | ORAL | Status: DC | PRN

## 2012-03-03 MED ORDER — EPHEDRINE 5 MG/ML INJ: 10.0000 mg | INTRAVENOUS | Status: DC | PRN

## 2012-03-03 MED ORDER — ONDANSETRON HCL 4 MG PO TABS: 4.0000 mg | ORAL_TABLET | ORAL | Status: DC | PRN

## 2012-03-03 MED ORDER — FLEET ENEMA 7-19 GM/118ML RE ENEM: 1.0000 | ENEMA | RECTAL | Status: DC | PRN

## 2012-03-03 MED ORDER — ZOLPIDEM TARTRATE 5 MG PO TABS: 5.0000 mg | ORAL_TABLET | Freq: Every evening | ORAL | Status: DC | PRN

## 2012-03-03 MED ORDER — DIBUCAINE 1 % RE OINT: 1.0000 "application " | TOPICAL_OINTMENT | RECTAL | Status: DC | PRN

## 2012-03-03 MED ORDER — OXYTOCIN 40 UNITS IN LACTATED RINGERS INFUSION - SIMPLE MED
62.5000 mL/h | INTRAVENOUS | Status: DC
  Administered 2012-03-03: 62.5 mL/h via INTRAVENOUS
  Filled 2012-03-03: qty 1000

## 2012-03-03 MED ORDER — MAGNESIUM SULFATE 40 MG/ML IJ SOLN: 4.0000 g | Freq: Once | INTRAMUSCULAR | Status: DC

## 2012-03-03 MED ORDER — SENNOSIDES-DOCUSATE SODIUM 8.6-50 MG PO TABS: 2.0000 | ORAL_TABLET | Freq: Every day | ORAL | Status: DC

## 2012-03-03 MED ORDER — SODIUM BICARBONATE 8.4 % IV SOLN
INTRAVENOUS | Status: DC | PRN
  Administered 2012-03-03: 4 mL via EPIDURAL

## 2012-03-03 NOTE — Anesthesia Procedure Notes (Signed)
Epidural Patient location during procedure: OB  Preanesthetic Checklist Completed: patient identified, site marked, surgical consent, pre-op evaluation, timeout performed, IV checked, risks and benefits discussed and monitors and equipment checked  Epidural Patient position: sitting Prep: site prepped and draped and DuraPrep Patient monitoring: continuous pulse ox and blood pressure Approach: midline Injection technique: LOR air  Needle:  Needle type: Tuohy  Needle gauge: 17 G Needle length: 9 cm and 9 Needle insertion depth: 4 cm Catheter type: closed end flexible Catheter size: 19 Gauge Catheter at skin depth: 10 cm Test dose: negative  Assessment Events: blood not aspirated, injection not painful, no injection resistance, negative IV test and no paresthesia  Additional Notes Dosing of Epidural:  1st dose, through catheter ............................................. epi 1:200K + Xylocaine 40 mg  2nd dose, through catheter, after waiting 3 minutes.....epi 1:200K + Xylocaine 40 mg   ( 2% Xylo charted as a single dose in Epic Meds for ease of charting; actual dosing was fractionated as above, for saftey's sake)  As each dose occurred, patient was free of IV sx; and patient exhibited no evidence of SA injection.  Patient is more comfortable after epidural dosed. Please see RN's note for documentation of vital signs,and FHR which are stable.  Patient reminded not to try to ambulate with numb legs, and that an RN must be present the 1st time she attempts to get up.    

## 2012-03-03 NOTE — Progress Notes (Signed)
Misty Ewing is a 21 y.o. [phone] at 41w4 with no prenatal care records available, having allegedly been seen x 2 in sept at Dr Elsie Stain , no [facility] since.  She presents in active labor  With size more consistent with a near -term infant.(34+ cm.) Bedside u/s being performed shows an EFW of 6+0 oz, 35+5 weeks, and FL suggests 37 weeks. Subjective:   Objective: BP 96/54  Pulse 88  Temp 97.4 F (36.3 C) (Oral)  Resp 20  SpO2 100%   Total I/O In: 724.2 [I.V.:724.2] Out: 0   FHT:  FHR: 145 bpm, variability: moderate,  accelerations:  Present,  decelerations:  Absent UC:   regular, every 5 minutes SVE:   Dilation: 8 (FFN obtained prior to sve.) Effacement (%): 100 Exam by:: Lupita Leash, rn   Labs: Lab Results  Component Value Date   WBC 6.6 03/03/2012   HGB 12.0 03/03/2012   HCT 34.9* 03/03/2012   MCV 90.2 03/03/2012   PLT 248 03/03/2012    Assessment / Plan: Spontaneous labor, progressing normally  Early term or near term infant No prenatal care GBS unknown  Alleged PCN allergy (anaphylaxis) Desires to sign Btl Papers  PLAN: Stop Mag sulfate D/c BMZ Epidural Allow to labor Continue Vancomycin for unknown GBS Anticipate SVD Obtain UDS Labor: Progressing normally Preeclampsia:  no evidence Fetal Wellbeing:  Category I Pain Control:  Epidural I/D:   Anticipated MOD:  NSVD  Anessia Oakland V 03/03/2012, 11:10 AM

## 2012-03-03 NOTE — Anesthesia Preprocedure Evaluation (Signed)

## 2012-03-03 NOTE — MAU Note (Signed)
Repeat call to pharmacy requesting Betamethasone.  Unable to transfer pt to L&D prior to receiving.  NICU aware of adm (no PNC at 28.4wks- active labor)

## 2012-03-03 NOTE — H&P (Signed)
Attestation of Attending Supervision of Advanced Practitioner: Evaluation and management procedures were performed by the PA/NP/CNM/OB Fellow under my supervision/collaboration. Chart reviewed and agree with management and plan. Due to initial uncertainty regarding EDC, pt begun on Mag Sulfate for neuroprophylaxis and BMZ 12 mg im.  NOw that dating confirms a near term or term infant , will stop Mag sulfate and allow to proceed to delivery. Uds, rapid HIV, and prenatal package ordered  Rydge Texidor V 03/03/2012 11:19 AM

## 2012-03-03 NOTE — MAU Note (Signed)
Patient is brought in by the ems with c/o contractions q2-94minutes since 6am. She denies vaginal bleeding or lof. Reports good fetal movement. Denies recent intercourse

## 2012-03-03 NOTE — H&P (Signed)
Misty Ewing is a 21 y.o. female presenting for labor. Pt presents with regular contractions that start 600AM this morning with small amount of vaginal bleeding.   Pt had 4 vaginal deliveries and the last one was a preterm. NO prenatal care. Maternal Medical History:  Reason for admission: Reason for admission: contractions.  Contractions: Onset was 3-5 hours ago.   Frequency: regular.   Duration is approximately 5 minutes.   Perceived severity is moderate.    Fetal activity: Perceived fetal activity is normal.   Last perceived fetal movement was within the past hour.      OB History    Grav Para Term Preterm Abortions TAB SAB Ect Mult Living   4 3 3  0 0 0 0 0 0 3     Past Medical History  Diagnosis Date  . Depression    Past Surgical History  Procedure Date  . No past surgeries    Family History: family history is negative for Anesthesia problems, and Hypotension, and Malignant hyperthermia, and Pseudochol deficiency, . Social History:  reports that she has been smoking Cigarettes.  She does not have any smokeless tobacco history on file. She reports that she drinks alcohol. She reports that she does not use illicit drugs.   Prenatal Transfer Tool  Maternal Diabetes: No Genetic Screening: Declined Maternal Ultrasounds/Referrals: Declined Fetal Ultrasounds or other Referrals:  None Maternal Substance Abuse:  No Significant Maternal Medications:  None Significant Maternal Lab Results:  None Other Comments:  None  Review of Systems  Constitutional: Negative for fever.  Genitourinary: Negative for dysuria.  Neurological: Negative for headaches.    Dilation: 8 (FFN obtained prior to sve.) Effacement (%): 100 Exam by:: Lupita Leash, rn  Blood pressure 98/65, pulse 112, temperature 97.4 F (36.3 C), temperature source Oral, resp. rate 18. Maternal Exam:  Uterine Assessment: Contraction strength is moderate.  Contraction frequency is regular.   Abdomen: Fetal  presentation: vertex     Fetal Exam Fetal Monitor Review: Mode: hand-held doppler probe.   Baseline rate: 130.  Variability: moderate (6-25 bpm).   Pattern: accelerations present and no decelerations.    Fetal State Assessment: Category I - tracings are normal.     Physical Exam  Prenatal labs: ABO, Rh:   A positive Antibody:   Rubella:   RPR:    HBsAg:    HIV:    GBS:     Assessment/Plan: Assessment: 1. Labor: Active pre term labor  2. Fetal Wellbeing: Category I  3. Pain Control: none 4. GBS: unknow  Plan:  1. Admit to BS 2. Bethametasone 3. Mg 4. UDS 5. Routine L&D orders 6. Analgesia/anesthesia PRN      Governor Specking 03/03/2012, 9:44 AM

## 2012-03-03 NOTE — Progress Notes (Signed)
Magnesium stopped per Dr Emelda Fear.  Ultrasound at bedside confirms pregnancy to be between 35-37 weeks.  Vancomycin continued for unknown GBS coverage

## 2012-03-03 NOTE — Progress Notes (Signed)
Misty Ewing is a 21 y.o. [phone] at [redacted]w[redacted]d by LMP admitted for active labor  Subjective: At bedside in Birth Suites fundal height was checked and found 32cm.   Objective: BP 100/62  Pulse 100  Temp 98 F (36.7 C) (Oral)  Resp 20  SpO2 99%   Total I/O In: 724.2 [I.V.:724.2] Out: 0   FHT:  FHR: 130's bpm, variability: moderate,  accelerations:  Present,  decelerations:  Absent UC:   regular, every 2-3.5 minutes SVE:   Dilation: 9 Effacement (%): 100  Labs: Lab Results  Component Value Date   WBC 6.6 03/03/2012   HGB 12.0 03/03/2012   HCT 34.9* 03/03/2012   MCV 90.2 03/03/2012   PLT 248 03/03/2012    Assessment / Plan: Spontaneous labor, progressing normally  Labor: Progressing normally Preeclampsia:  n/a Fetal Wellbeing:  Category I Pain Control:  Epidural per pt request I/D:  n/a Anticipated MOD:  NSVD; Bedside ultrasound to confirm gestational age.  Governor Specking 03/03/2012, 12:15 PM

## 2012-03-04 ENCOUNTER — Ambulatory Visit (INDEPENDENT_AMBULATORY_CARE_PROVIDER_SITE_OTHER): Payer: Medicaid Other

## 2012-03-04 VITALS — BP 101/62 | HR 56 | Wt 119.0 lb

## 2012-03-04 DIAGNOSIS — Z3049 Encounter for surveillance of other contraceptives: Secondary | ICD-10-CM

## 2012-03-04 MED ORDER — INFLUENZA VIRUS VACC SPLIT PF IM SUSP
0.5000 mL | Freq: Once | INTRAMUSCULAR | Status: AC
  Administered 2012-03-04: 0.5 mL via INTRAMUSCULAR

## 2012-03-04 MED ORDER — INFLUENZA VIRUS VACC SPLIT PF IM SUSP: 0.5000 mL | INTRAMUSCULAR | Status: DC

## 2012-03-04 MED ORDER — INFLUENZA VIRUS VACC SPLIT PF IM SUSP: 0.5000 mL | Freq: Once | INTRAMUSCULAR | Status: DC

## 2012-03-04 MED ORDER — MEDROXYPROGESTERONE ACETATE 150 MG/ML IM SUSP
150.0000 mg | Freq: Once | INTRAMUSCULAR | Status: AC
  Administered 2012-03-04: 150 mg via INTRAMUSCULAR

## 2012-03-04 NOTE — Progress Notes (Signed)
[employer] after hours worker, Jesusita Oka Rapport confirmed that pt's case was closed in September 2013, non substantiated.

## 2012-03-04 NOTE — Clinical Social Work Maternal (Signed)
Clinical Social Work Department  PSYCHOSOCIAL ASSESSMENT - MATERNAL/CHILD  03/04/2012  Patient: Misty Ewing,Misty Ewing Account Number: [id number] Admit Date: 03/03/2012  Childs Name:  Na' Stosha Pettway-Smith   Clinical Social Worker: Tryniti Laatsch, LCSW Date/Time: 03/04/2012 12:59 PM  Date Referred: 03/04/2012  Referral source   CN    Referred reason   Depression/Anxiety   Other referral source:  I: FAMILY / HOME ENVIRONMENT  Child's legal guardian: PARENT  Guardian - Name  Guardian - Age  Guardian - Address   Misty Ewing  21  [address] [place], [place] [address]   Tommy Pettway-Smith  30    Other household support members/support persons  Name  Relationship  DOB   Sharon Camack  MOTHER     SON  09/02/2006   Other support:  II PSYCHOSOCIAL DATA  Information Source: Patient Interview  Financial and Community Resources  Employment:  Financial resources: Medicaid  If Medicaid - County: [place]  Other   WIC   School / Grade:  Maternity Care Coordinator / Child Services Coordination / Early Interventions: Cultural issues impacting care:  III STRENGTHS  Strengths   Adequate Resources   Home prepared for Child (including basic supplies)   Supportive family/friends   Strength comment:  IV RISK FACTORS AND CURRENT PROBLEMS  Current Problem: YES  Risk Factor & Current Problem  Patient Issue  Family Issue  Risk Factor / Current Problem Comment   Mental Illness  Y  N  Hx of depression   Other - See comment  Y  N  NPNC   V SOCIAL WORK ASSESSMENT  CSW referral received to assess reason for NPNC & history of depression. Pt told CSW that she did not seek PNC because she does not like to be seen by multiple providers, unfamiliar with her history. She denies any illegal substance use history and verbalized understanding of hospital drug testing policy. UDS is negative, meconium results are pending. Pt was accompanied by her boyfriend, who is not the FOB, who was not present for the  beginning of conversation. According to pt, he thinks the baby came prematurely (@ 28 weeks). Pt told CSW in confidence, that there is not way he is the FOB & claims to have told him the truth. According to the pt, he still wants to sign the birth certificate & be involved. CSW is not sure that pt told her boyfriend the truth about the infants gestation and the fact that he is not the FOB. Pt does not have all the necessary supplies for the infant. FOB explained they lacked supplies because she baby was born "very early." Pt appeared to be nervous while FOB in the room and maintained eye contact with this CSW. CSW informed pt and identified FOB about car seat fee, if interested and provided them with a bundle pack of clothing. Pt's boyfriend states he has a lot of family that will assist with supplies. Pt identified her mother, step mother and boyfriend, as primary support system. Pt denies depression history or SI. Pt has 3 children, 2 of which who lives with their father. Pt was recently involved with CPS this year but reports the case is closed now. CPS worker was Tiffany Lash, as per pt. She denies that any of her children were removed from her care. CSW attempted to call [place] [employer] to verify information, however office closed due to holiday. CSW will try after hours number and continue to monitor drug screen results.   VI SOCIAL WORK   PLAN  Social Work Plan   No Further Intervention Required / No Barriers to Discharge   Type of pt/family education:  If child protective services report - county:  If child protective services report - date:  Information/referral to community resources comment:  Other social work plan:   

## 2012-03-04 NOTE — Progress Notes (Signed)
Post Partum Day1 Subjective: no complaints, up ad lib, voiding, tolerating PO and + flatus  Objective: Blood pressure 96/59, pulse 65, temperature 98.1 F (36.7 C), temperature source Oral, resp. rate 18, SpO2 99.00%, unknown if currently breastfeeding.  Physical Exam:  General: alert, cooperative and no distress Lochia: appropriate Uterine Fundus: firm DVT Evaluation: No evidence of DVT seen on physical exam.   Basename 03/03/12 0951  HGB 12.0  HCT 34.9*    Assessment/Plan: Plan for discharge tomorrow, Breastfeeding and Contraception Depo Shot   LOS: 1 day   Governor Specking 03/04/2012, 7:42 AM   I examined pt and agree with documentation above and resident plan of care. [facility]

## 2012-03-04 NOTE — Progress Notes (Signed)
UR completed 

## 2012-03-05 MED ORDER — IBUPROFEN 600 MG PO TABS: 600.0000 mg | ORAL_TABLET | Freq: Four times a day (QID) | ORAL | Status: DC

## 2012-03-05 MED ORDER — PRENATAL MULTIVITAMIN CH: 1.0000 | ORAL_TABLET | Freq: Every day | ORAL | Status: DC

## 2012-03-05 NOTE — Discharge Summary (Signed)
Agree with above note.  Misty Ewing H. 03/05/2012 10:09 AM  

## 2012-03-05 NOTE — Discharge Summary (Signed)
Obstetric Discharge Summary Reason for Admission: onset of labor Prenatal Procedures: none Intrapartum Procedures: spontaneous vaginal delivery Postpartum Procedures: none Complications-Operative and Postpartum: none Hemoglobin  Date Value Range Status  03/03/2012 12.0  12.0 - 15.0 g/dL Final     HCT  Date Value Range Status  03/03/2012 34.9* 36.0 - 46.0 % Final    Physical Exam:  General: alert, cooperative and no distress Lochia: appropriate Uterine Fundus: firm DVT Evaluation: No evidence of DVT seen on physical exam.  Discharge Diagnoses: Term Pregnancy-delivered  Discharge Information: Date: 03/05/2012 Activity: pelvic rest Diet: routine Medications: Ibuprofen Condition: stable Instructions: refer to practice specific booklet Discharge to: home Birth Control: Depo shot on 03/04/12 Breastfeeeding  Follow-up Information    Follow up with [facility]. In 6 weeks.   Contact information:   [address] [place] [address]          Newborn Data: Live born female  Birth Weight: 6 lb 1.7 oz (2770 g) APGAR: 8, 9  Home with mother.  Governor Specking 03/05/2012, 7:34 AM  I have seen the patient with the resident/student and agree with the above.

## 2012-04-01 ENCOUNTER — Ambulatory Visit: Payer: Self-pay | Admitting: Medical

## 2012-04-26 ENCOUNTER — Ambulatory Visit: Payer: Self-pay | Admitting: Medical

## 2012-06-02 ENCOUNTER — Ambulatory Visit: Payer: Self-pay

## 2012-06-03 ENCOUNTER — Ambulatory Visit (INDEPENDENT_AMBULATORY_CARE_PROVIDER_SITE_OTHER): Payer: Medicaid Other

## 2012-06-03 VITALS — BP 118/76 | HR 94 | Temp 97.1°F | Ht 61.25 in | Wt 126.7 lb

## 2012-06-03 DIAGNOSIS — Z3049 Encounter for surveillance of other contraceptives: Secondary | ICD-10-CM

## 2012-06-03 DIAGNOSIS — Z3042 Encounter for surveillance of injectable contraceptive: Secondary | ICD-10-CM

## 2012-06-03 MED ORDER — MEDROXYPROGESTERONE ACETATE 150 MG/ML IM SUSP: 150.0000 mg | INTRAMUSCULAR | Status: DC

## 2012-06-03 MED ORDER — MEDROXYPROGESTERONE ACETATE 150 MG/ML IM SUSP
150.0000 mg | Freq: Once | INTRAMUSCULAR | Status: AC
  Administered 2012-06-03: 150 mg via INTRAMUSCULAR

## 2012-08-19 ENCOUNTER — Ambulatory Visit (INDEPENDENT_AMBULATORY_CARE_PROVIDER_SITE_OTHER): Payer: Medicaid Other | Admitting: General Practice

## 2012-08-19 VITALS — BP 109/69 | HR 84 | Temp 97.4°F | Ht 60.0 in | Wt 131.5 lb

## 2012-08-19 DIAGNOSIS — Z3049 Encounter for surveillance of other contraceptives: Secondary | ICD-10-CM

## 2012-08-19 MED ORDER — MEDROXYPROGESTERONE ACETATE 150 MG/ML IM SUSP
150.0000 mg | Freq: Once | INTRAMUSCULAR | Status: AC
  Administered 2012-08-19: 150 mg via INTRAMUSCULAR

## 2012-08-20 MED ORDER — EPINEPHRINE 0.3 MG/0.3ML IJ SOAJ: 0.3000 mg | Freq: Once | INTRAMUSCULAR | Status: DC

## 2012-10-08 ENCOUNTER — Ambulatory Visit (INDEPENDENT_AMBULATORY_CARE_PROVIDER_SITE_OTHER): Payer: Medicaid Other | Admitting: *Deleted

## 2012-10-08 DIAGNOSIS — Z111 Encounter for screening for respiratory tuberculosis: Secondary | ICD-10-CM

## 2012-10-08 MED ORDER — TUBERCULIN PPD 5 UNIT/0.1ML ID SOLN
5.0000 [IU] | Freq: Once | INTRADERMAL | Status: AC
  Administered 2012-10-08: 5 [IU] via INTRADERMAL

## 2012-10-08 NOTE — Progress Notes (Signed)
Pt given tb skin test on left forearm. She will return Monday for skin test read and for form completion. Pt agrees to return to clinic.

## 2012-10-11 NOTE — Progress Notes (Signed)
Pt in for tb skin test to be read. Left arm observed to have no redness or induration at all. Form completed for patient.

## 2012-10-19 ENCOUNTER — Encounter: Payer: Self-pay | Admitting: *Deleted

## 2012-11-01 ENCOUNTER — Ambulatory Visit: Payer: Medicaid Other

## 2012-11-04 ENCOUNTER — Ambulatory Visit: Payer: Medicaid Other

## 2012-11-11 ENCOUNTER — Ambulatory Visit: Payer: Medicaid Other

## 2012-11-17 ENCOUNTER — Ambulatory Visit (INDEPENDENT_AMBULATORY_CARE_PROVIDER_SITE_OTHER): Payer: Medicaid Other | Admitting: *Deleted

## 2012-11-17 VITALS — BP 102/67 | HR 86 | Wt 133.0 lb

## 2012-11-17 DIAGNOSIS — Z3049 Encounter for surveillance of other contraceptives: Secondary | ICD-10-CM

## 2012-11-17 MED ORDER — MEDROXYPROGESTERONE ACETATE 150 MG/ML IM SUSP
150.0000 mg | Freq: Once | INTRAMUSCULAR | Status: AC
  Administered 2012-11-17: 150 mg via INTRAMUSCULAR

## 2013-02-02 ENCOUNTER — Ambulatory Visit: Payer: Medicaid Other

## 2013-04-05 ENCOUNTER — Ambulatory Visit: Payer: Medicaid Other

## 2013-08-08 NOTE — Clinical Documentation Improvement (Signed)
Chart accessed for purpose of chart audit in reference to [place] [employer].  

## 2013-10-13 ENCOUNTER — Emergency Department (HOSPITAL_COMMUNITY): Payer: Medicaid Other

## 2013-10-13 ENCOUNTER — Emergency Department (HOSPITAL_COMMUNITY)
Admission: EM | Admit: 2013-10-13 | Discharge: 2013-10-13 | Disposition: A | Discharge status: Home / Self Care | Payer: Medicaid Other | Attending: Emergency Medicine | Admitting: Emergency Medicine

## 2013-10-13 ENCOUNTER — Encounter (HOSPITAL_COMMUNITY): Payer: Self-pay | Admitting: Emergency Medicine

## 2013-10-13 DIAGNOSIS — Z79899 Other long term (current) drug therapy: Secondary | ICD-10-CM | POA: Diagnosis not present

## 2013-10-13 DIAGNOSIS — Z880 Allergy status to penicillin: Secondary | ICD-10-CM | POA: Diagnosis not present

## 2013-10-13 DIAGNOSIS — R110 Nausea: Secondary | ICD-10-CM | POA: Insufficient documentation

## 2013-10-13 DIAGNOSIS — Z8659 Personal history of other mental and behavioral disorders: Secondary | ICD-10-CM | POA: Diagnosis not present

## 2013-10-13 DIAGNOSIS — J029 Acute pharyngitis, unspecified: Secondary | ICD-10-CM

## 2013-10-13 DIAGNOSIS — R51 Headache: Secondary | ICD-10-CM | POA: Diagnosis not present

## 2013-10-13 DIAGNOSIS — Z3202 Encounter for pregnancy test, result negative: Secondary | ICD-10-CM | POA: Insufficient documentation

## 2013-10-13 DIAGNOSIS — N3000 Acute cystitis without hematuria: Secondary | ICD-10-CM | POA: Diagnosis not present

## 2013-10-13 DIAGNOSIS — F17200 Nicotine dependence, unspecified, uncomplicated: Secondary | ICD-10-CM | POA: Insufficient documentation

## 2013-10-13 DIAGNOSIS — R000 Tachycardia, unspecified: Secondary | ICD-10-CM | POA: Insufficient documentation

## 2013-10-13 LAB — URINALYSIS, ROUTINE W REFLEX MICROSCOPIC
Glucose, UA: NEGATIVE mg/dL
Hgb urine dipstick: NEGATIVE
Ketones, ur: 40 mg/dL — AB
NITRITE: POSITIVE — AB
Protein, ur: NEGATIVE mg/dL
SPECIFIC GRAVITY, URINE: 1.025 (ref 1.005–1.030)
UROBILINOGEN UA: 4 mg/dL — AB (ref 0.0–1.0)
pH: 6 (ref 5.0–8.0)

## 2013-10-13 LAB — CBC WITH DIFFERENTIAL/PLATELET
Basophils Absolute: 0 10*3/uL (ref 0.0–0.1)
Basophils Relative: 0 % (ref 0–1)
EOS PCT: 2 % (ref 0–5)
Eosinophils Absolute: 0.2 10*3/uL (ref 0.0–0.7)
HEMATOCRIT: 41.6 % (ref 36.0–46.0)
Hemoglobin: 14.4 g/dL (ref 12.0–15.0)
LYMPHS ABS: 1.3 10*3/uL (ref 0.7–4.0)
LYMPHS PCT: 15 % (ref 12–46)
MCH: 30 pg (ref 26.0–34.0)
MCHC: 34.6 g/dL (ref 30.0–36.0)
MCV: 86.7 fL (ref 78.0–100.0)
Monocytes Absolute: 0.8 10*3/uL (ref 0.1–1.0)
Monocytes Relative: 9 % (ref 3–12)
Neutro Abs: 6.7 10*3/uL (ref 1.7–7.7)
Neutrophils Relative %: 74 % (ref 43–77)
Platelets: 216 10*3/uL (ref 150–400)
RBC: 4.8 MIL/uL (ref 3.87–5.11)
RDW: 12.5 % (ref 11.5–15.5)
WBC: 9 10*3/uL (ref 4.0–10.5)

## 2013-10-13 LAB — COMPREHENSIVE METABOLIC PANEL
ALT: 9 U/L (ref 0–35)
ANION GAP: 13 (ref 5–15)
AST: 17 U/L (ref 0–37)
Albumin: 3.6 g/dL (ref 3.5–5.2)
Alkaline Phosphatase: 58 U/L (ref 39–117)
BUN: 13 mg/dL (ref 6–23)
CALCIUM: 8.9 mg/dL (ref 8.4–10.5)
CO2: 21 meq/L (ref 19–32)
CREATININE: 0.68 mg/dL (ref 0.50–1.10)
Chloride: 103 mEq/L (ref 96–112)
GFR calc non Af Amer: >90 mL/min (ref >90)
GLUCOSE: 93 mg/dL (ref 70–99)
Potassium: 3.8 mEq/L (ref 3.7–5.3)
Sodium: 137 mEq/L (ref 137–147)
Total Bilirubin: 0.4 mg/dL (ref 0.3–1.2)
Total Protein: 8.2 g/dL (ref 6.0–8.3)

## 2013-10-13 LAB — I-STAT CG4 LACTIC ACID, ED: Lactic Acid, Venous: 0.92 mmol/L (ref 0.5–2.2)

## 2013-10-13 LAB — URINE MICROSCOPIC-ADD ON

## 2013-10-13 LAB — POC URINE PREG, ED: PREG TEST UR: NEGATIVE

## 2013-10-13 LAB — RAPID STREP SCREEN (MED CTR MEBANE ONLY): STREPTOCOCCUS, GROUP A SCREEN (DIRECT): NEGATIVE

## 2013-10-13 MED ORDER — SULFAMETHOXAZOLE-TMP DS 800-160 MG PO TABS
1.0000 | ORAL_TABLET | Freq: Once | ORAL | Status: AC
  Administered 2013-10-13: 1 via ORAL
  Filled 2013-10-13: qty 1

## 2013-10-13 MED ORDER — ACETAMINOPHEN 325 MG PO TABS
650.0000 mg | ORAL_TABLET | Freq: Four times a day (QID) | ORAL | Status: DC | PRN
  Administered 2013-10-13: 650 mg via ORAL
  Filled 2013-10-13: qty 2

## 2013-10-13 MED ORDER — SODIUM CHLORIDE 0.9 % IV BOLUS (SEPSIS)
1000.0000 mL | Freq: Once | INTRAVENOUS | Status: AC
  Administered 2013-10-13: 1000 mL via INTRAVENOUS

## 2013-10-13 MED ORDER — SODIUM CHLORIDE 0.9 % IV BOLUS (SEPSIS)
1000.0000 mL | Freq: Once | INTRAVENOUS | Status: AC
Start: 2013-10-13 — End: 2013-10-13
  Administered 2013-10-13: 1000 mL via INTRAVENOUS

## 2013-10-13 MED ORDER — SULFAMETHOXAZOLE-TMP DS 800-160 MG PO TABS: 1.0000 | ORAL_TABLET | Freq: Two times a day (BID) | ORAL | Status: DC

## 2013-10-13 NOTE — ED Notes (Signed)
Pt asked for urine sample. Pt reports she is unable to provide right now.

## 2013-10-13 NOTE — ED Provider Notes (Signed)
CSN: [id number]     Arrival date & time 10/13/13  [id number] History   First MD Initiated Contact with Patient 10/13/13 [phone]     Chief Complaint  Patient presents with  . Code Sepsis     (Consider location/radiation/quality/duration/timing/severity/associated sxs/prior Treatment) Patient is a 23 y.o. female presenting with pharyngitis. The history is provided by the patient.  Sore Throat This is a new problem. The current episode started more than 2 days ago (started 6 days). The problem occurs constantly. The problem has been gradually worsening. Associated symptoms include headaches. Pertinent negatives include no chest pain and no abdominal pain. Nothing aggravates the symptoms. Nothing relieves the symptoms. The treatment provided no relief.    Past Medical History  Diagnosis Date  . Depression    Past Surgical History  Procedure Laterality Date  . No past surgeries     Family History  Problem Relation Age of Onset  . Anesthesia problems Neg Hx   . Hypotension Neg Hx   . Malignant hyperthermia Neg Hx   . Pseudochol deficiency Neg Hx    History  Substance Use Topics  . Smoking status: Current Every Day Smoker -- 1.00 packs/day    Types: Cigarettes  . Smokeless tobacco: Not on file  . Alcohol Use: Yes   OB History   Grav Para Term Preterm Abortions TAB SAB Ect Mult Living   4 4 3 1  0 0 0 0 0 4     Review of Systems  Constitutional: Positive for fever and chills.  HENT: Positive for sore throat. Negative for congestion, ear pain, facial swelling and trouble swallowing.   Cardiovascular: Negative for chest pain.  Gastrointestinal: Negative for abdominal pain.  Neurological: Positive for headaches.  All other systems reviewed and are negative.     Allergies  Bee venom and Penicillins  Home Medications   Prior to Admission medications   Medication Sig Start Date End Date Taking? Authorizing Provider  EPINEPHrine (EPI-PEN) 0.3 mg/0.3 mL DEVI Inject 0.3 mLs (0.3 mg  total) into the muscle once. 08/19/12   Tereso NewcomerUgonna A Anyanwu, MD  ibuprofen (ADVIL,MOTRIN) 600 MG tablet Take 1 tablet (600 mg total) by mouth every 6 (six) hours. 03/05/12   Governor SpeckingViviane E Sachs, MD  Prenatal Vit-Fe Fumarate-FA (PRENATAL MULTIVITAMIN) TABS Take 1 tablet by mouth daily. 03/05/12   Governor SpeckingViviane E Sachs, MD   BP 99/65  Pulse 124  Temp(Src) 102.9 F (39.4 C) (Rectal)  Resp 27  SpO2 94% Physical Exam  Nursing note and vitals reviewed. Constitutional: She is oriented to person, place, and time. She appears well-developed and well-nourished. No distress.  HENT:  Head: Normocephalic and atraumatic.  Mouth/Throat: Oropharyngeal exudate (white on bilateral tonsils) and posterior oropharyngeal erythema present. No posterior oropharyngeal edema or tonsillar abscesses.  Eyes: EOM are normal. Pupils are equal, round, and reactive to light.  Neck: Normal range of motion. Neck supple. Tracheal tenderness (bilateral submandibular, mild lymphadenopathy) present. No rigidity. No edema present. No Brudzinski's sign and no Kernig's sign noted.  Cardiovascular: Regular rhythm.  Tachycardia present.  Exam reveals no friction rub.   No murmur heard. Pulmonary/Chest: Effort normal and breath sounds normal. No respiratory distress. She has no wheezes. She has no rales.  Abdominal: Soft. She exhibits no distension. There is no tenderness. There is no rebound.  Musculoskeletal: Normal range of motion. She exhibits no edema.  Neurological: She is alert and oriented to person, place, and time.  Skin: No rash noted. She is not diaphoretic.  ED Course  Procedures (including critical care time) Labs Review Labs Reviewed  CULTURE, BLOOD (ROUTINE X 2)  CULTURE, BLOOD (ROUTINE X 2)  URINE CULTURE  RAPID STREP SCREEN  CBC WITH DIFFERENTIAL  COMPREHENSIVE METABOLIC PANEL  URINALYSIS, ROUTINE W REFLEX MICROSCOPIC  I-STAT CG4 LACTIC ACID, ED  POC URINE PREG, ED    Imaging Review No results found.   EKG  Interpretation   Date/Time:  Thursday October 13 2013 08:45:56 EDT Ventricular Rate:  134 PR Interval:  123 QRS Duration: 81 QT Interval:  296 QTC Calculation: 442 R Axis:   99 Text Interpretation:  Sinus tachycardia Multiform ventricular premature  complexes Borderline right axis deviation Tachycardia new. Morphologies  similar to prior Confirmed by [facility]  MD, Annalycia Done ([id number]) on 10/13/2013 8:54:25  AM      MDM   Final diagnoses:  Pharyngitis  Acute cystitis without hematuria    23 year old female here with sore throat, fever. Sore throat began 6 days ago, headaches and chills began 3 days ago. Progressively worsening. Some mild nausea without any vomiting or diarrhea. Here she has a rectal temp of 102.9. She is tachycardic in the 130s. Her pressures are mildly soft and 95/57. Cuts of the skull base to vital sign criteria. On exam patient is very well-appearing, she's taking care of her child asking for something to drink. She has full range of motion of her neck and it is supple. She does have some neck tenderness but it is submandibular teeth or jaw or her lymph nodes are mildly swollen. Oropharyngeal exam shows a swollen tonsils with whitish exudate on bilateral tonsils. TMs are clear. Belly is soft and benign. Lungs are clear. I suspect the source of her illness is her pharyngitis. Rapid strep sent. Antibiotics held as she does not need broad-spectrum [place] immediately and we are waiting a rapid strep. I do suspect this is viral. She is 23 years old, young and healthy. No concern for meningitis. I suspect her headaches, anterior neck pain, and other symptoms are secondary to her pharyngitis. Patient feeling much better, ambulatory with her kids, eating after fluids. HRs greatly improving, down into low 100s. UA shows UTI. Given bactrim. Stable for discharge.  Elwin Mocha, MD 10/13/13 [phone]

## 2013-10-13 NOTE — Discharge Instructions (Signed)
Pharyngitis °Pharyngitis is redness, pain, and swelling (inflammation) of your pharynx.  °CAUSES  °Pharyngitis is usually caused by infection. Most of the time, these infections are from viruses (viral) and are part of a cold. However, sometimes pharyngitis is caused by bacteria (bacterial). Pharyngitis can also be caused by allergies. Viral pharyngitis may be spread from person to person by coughing, sneezing, and personal items or utensils (cups, forks, spoons, toothbrushes). Bacterial pharyngitis may be spread from person to person by more intimate contact, such as kissing.  °SIGNS AND SYMPTOMS  °Symptoms of pharyngitis include:   °· Sore throat.   °· Tiredness (fatigue).   °· Low-grade fever.   °· Headache. °· Joint pain and muscle aches. °· Skin rashes. °· Swollen lymph nodes. °· Plaque-like film on throat or tonsils (often seen with bacterial pharyngitis). °DIAGNOSIS  °Your health care provider will ask you questions about your illness and your symptoms. Your medical history, along with a physical exam, is often all that is needed to diagnose pharyngitis. Sometimes, a rapid strep test is done. Other lab tests may also be done, depending on the suspected cause.  °TREATMENT  °Viral pharyngitis will usually get better in 3-4 days without the use of medicine. Bacterial pharyngitis is treated with medicines that kill germs (antibiotics).  °HOME CARE INSTRUCTIONS  °· Drink enough water and fluids to keep your urine clear or pale yellow.   °· Only take over-the-counter or prescription medicines as directed by your health care provider:   °· If you are prescribed antibiotics, make sure you finish them even if you start to feel better.   °· Do not take aspirin.   °· Get lots of rest.   °· Gargle with 8 oz of salt water (½ tsp of salt per 1 qt of water) as often as every 1-2 hours to soothe your throat.   °· Throat lozenges (if you are not at risk for choking) or sprays may be used to soothe your throat. °SEEK MEDICAL  CARE IF:  °· You have large, tender lumps in your neck. °· You have a rash. °· You cough up green, yellow-brown, or bloody spit. °SEEK IMMEDIATE MEDICAL CARE IF:  °· Your neck becomes stiff. °· You drool or are unable to swallow liquids. °· You vomit or are unable to keep medicines or liquids down. °· You have severe pain that does not go away with the use of recommended medicines. °· You have trouble breathing (not caused by a stuffy nose). °MAKE SURE YOU:  °· Understand these instructions. °· Will watch your condition. °· Will get help right away if you are not doing well or get worse. °Document Released: 02/24/2005 Document Revised: 12/15/2012 Document Reviewed: 11/01/2012 °ExitCare® Patient Information ©2015 [employer]. This information is not intended to replace advice given to you by your health care provider. Make sure you discuss any questions you have with your health care provider. ° °Urinary Tract Infection °Urinary tract infections (UTIs) can develop anywhere along your urinary tract. Your urinary tract is your body's drainage system for removing wastes and extra water. Your urinary tract includes two kidneys, two ureters, a bladder, and a urethra. Your kidneys are a pair of bean-shaped organs. Each kidney is about the size of your fist. They are located below your ribs, one on each side of your spine. °CAUSES °Infections are caused by microbes, which are microscopic organisms, including fungi, viruses, and bacteria. These organisms are so small that they can only be seen through a microscope. Bacteria are the microbes that most commonly cause UTIs. °  SYMPTOMS  °Symptoms of UTIs may vary by age and gender of the patient and by the location of the infection. Symptoms in young women typically include a frequent and intense urge to urinate and a painful, burning feeling in the bladder or urethra during urination. Older women and men are more likely to be tired, shaky, and weak and have muscle aches and  abdominal pain. A fever may mean the infection is in your kidneys. Other symptoms of a kidney infection include pain in your back or sides below the ribs, nausea, and vomiting. °DIAGNOSIS °To diagnose a UTI, your caregiver will ask you about your symptoms. Your caregiver also will ask to provide a urine sample. The urine sample will be tested for bacteria and white blood cells. White blood cells are made by your body to help fight infection. °TREATMENT  °Typically, UTIs can be treated with medication. Because most UTIs are caused by a bacterial infection, they usually can be treated with the use of antibiotics. The choice of antibiotic and length of treatment depend on your symptoms and the type of bacteria causing your infection. °HOME CARE INSTRUCTIONS °· If you were prescribed antibiotics, take them exactly as your caregiver instructs you. Finish the medication even if you feel better after you have only taken some of the medication. °· Drink enough water and fluids to keep your urine clear or pale yellow. °· Avoid caffeine, tea, and carbonated beverages. They tend to irritate your bladder. °· Empty your bladder often. Avoid holding urine for long periods of time. °· Empty your bladder before and after sexual intercourse. °· After a bowel movement, women should cleanse from front to back. Use each tissue only once. °SEEK MEDICAL CARE IF:  °· You have back pain. °· You develop a fever. °· Your symptoms do not begin to resolve within 3 days. °SEEK IMMEDIATE MEDICAL CARE IF:  °· You have severe back pain or lower abdominal pain. °· You develop chills. °· You have nausea or vomiting. °· You have continued burning or discomfort with urination. °MAKE SURE YOU:  °· Understand these instructions. °· Will watch your condition. °· Will get help right away if you are not doing well or get worse. °Document Released: 12/04/2004 Document Revised: 08/26/2011 Document Reviewed: 04/04/2011 °ExitCare® Patient Information ©2015  [employer]. This information is not intended to replace advice given to you by your health care provider. Make sure you discuss any questions you have with your health care provider. ° °

## 2013-10-13 NOTE — ED Notes (Signed)
Per PT[facility] - pt c/o sore throat and HA x 3 days, sts has also had a cough and on and off chills, but hasn't check temperature. C/o nausea but no v/d/abd pain. Nad, skin warm and dry, resp e/u.

## 2013-10-13 NOTE — ED Notes (Signed)
Portable xray at bedside.

## 2013-10-14 LAB — CULTURE, GROUP A STREP

## 2013-10-15 LAB — URINE CULTURE: Colony Count: >=100000

## 2013-10-16 ENCOUNTER — Telehealth (HOSPITAL_BASED_OUTPATIENT_CLINIC_OR_DEPARTMENT_OTHER): Payer: Self-pay | Admitting: Emergency Medicine

## 2013-10-16 NOTE — Telephone Encounter (Signed)
Post ED Visit - Positive Culture Follow-up  Culture report reviewed by antimicrobial stewardship pharmacist: []  Wes Dulaney, Pharm.D., BCPS []  Celedonio MiyamotoJeremy Frens, Pharm.D., BCPS []  Georgina PillionElizabeth Martin, [address] BCPS []  VelmaMinh Pham, [address] BCPS, AAHIVP [x]  Estella HuskMichelle Turner, Pharm.D., BCPS, AAHIVP []  Red ChristiansSamson Lee, Pharm.D. []  Tennis Mustassie Stewart, [place]  Positive urine culture Treated with Sulfa-Trimeth, organism sensitive to the same and no further patient follow-up is required at this time.  [place], Jenel LucksKylie 10/16/2013, 1:12 PM

## 2013-10-19 LAB — CULTURE, BLOOD (ROUTINE X 2)
CULTURE: NO GROWTH
Culture: NO GROWTH

## 2013-11-21 ENCOUNTER — Encounter (HOSPITAL_COMMUNITY): Payer: Self-pay | Admitting: Emergency Medicine

## 2013-11-21 ENCOUNTER — Emergency Department (HOSPITAL_COMMUNITY)
Admission: EM | Admit: 2013-11-21 | Discharge: 2013-11-22 | Disposition: A | Discharge status: Home / Self Care | Payer: Medicaid Other | Attending: Emergency Medicine | Admitting: Emergency Medicine

## 2013-11-21 DIAGNOSIS — Z3202 Encounter for pregnancy test, result negative: Secondary | ICD-10-CM | POA: Diagnosis not present

## 2013-11-21 DIAGNOSIS — Z792 Long term (current) use of antibiotics: Secondary | ICD-10-CM | POA: Insufficient documentation

## 2013-11-21 DIAGNOSIS — Z8659 Personal history of other mental and behavioral disorders: Secondary | ICD-10-CM | POA: Insufficient documentation

## 2013-11-21 DIAGNOSIS — Z880 Allergy status to penicillin: Secondary | ICD-10-CM | POA: Diagnosis not present

## 2013-11-21 DIAGNOSIS — F17200 Nicotine dependence, unspecified, uncomplicated: Secondary | ICD-10-CM | POA: Insufficient documentation

## 2013-11-21 DIAGNOSIS — N739 Female pelvic inflammatory disease, unspecified: Secondary | ICD-10-CM | POA: Insufficient documentation

## 2013-11-21 DIAGNOSIS — R109 Unspecified abdominal pain: Secondary | ICD-10-CM | POA: Diagnosis present

## 2013-11-21 DIAGNOSIS — N730 Acute parametritis and pelvic cellulitis: Secondary | ICD-10-CM

## 2013-11-21 LAB — WET PREP, GENITAL
Trich, Wet Prep: NONE SEEN
YEAST WET PREP: NONE SEEN

## 2013-11-21 LAB — URINALYSIS, ROUTINE W REFLEX MICROSCOPIC
GLUCOSE, UA: NEGATIVE mg/dL
Ketones, ur: NEGATIVE mg/dL
Nitrite: NEGATIVE
Protein, ur: NEGATIVE mg/dL
Specific Gravity, Urine: 1.029 (ref 1.005–1.030)
Urobilinogen, UA: 1 mg/dL (ref 0.0–1.0)
pH: 6 (ref 5.0–8.0)

## 2013-11-21 LAB — CBC
HCT: 43.6 % (ref 36.0–46.0)
HEMOGLOBIN: 15.1 g/dL — AB (ref 12.0–15.0)
MCH: 30.8 pg (ref 26.0–34.0)
MCHC: 34.6 g/dL (ref 30.0–36.0)
MCV: 89 fL (ref 78.0–100.0)
Platelets: 269 10*3/uL (ref 150–400)
RBC: 4.9 MIL/uL (ref 3.87–5.11)
RDW: 13.1 % (ref 11.5–15.5)
WBC: 11.4 10*3/uL — AB (ref 4.0–10.5)

## 2013-11-21 LAB — BASIC METABOLIC PANEL
Anion gap: 14 (ref 5–15)
BUN: 13 mg/dL (ref 6–23)
CHLORIDE: 103 meq/L (ref 96–112)
CO2: 22 meq/L (ref 19–32)
Calcium: 9.3 mg/dL (ref 8.4–10.5)
Creatinine, Ser: 0.65 mg/dL (ref 0.50–1.10)
GFR calc Af Amer: >90 mL/min (ref >90)
GFR calc non Af Amer: >90 mL/min (ref >90)
GLUCOSE: 101 mg/dL — AB (ref 70–99)
Potassium: 3.6 mEq/L — ABNORMAL LOW (ref 3.7–5.3)
SODIUM: 139 meq/L (ref 137–147)

## 2013-11-21 LAB — URINE MICROSCOPIC-ADD ON

## 2013-11-21 MED ORDER — SODIUM CHLORIDE 0.9 % IV BOLUS (SEPSIS): 1000.0000 mL | Freq: Once | INTRAVENOUS | Status: DC

## 2013-11-21 NOTE — ED Provider Notes (Signed)
CSN: [id number]     Arrival date & time 11/21/13  2037 History   First MD Initiated Contact with Patient 11/21/13 2108     Chief Complaint  Patient presents with  . Abdominal Pain     (Consider location/radiation/quality/duration/timing/severity/associated sxs/prior Treatment) The history is provided by the patient and medical records. No language interpreter was used.    Misty Ewing is a 23 y.o. female  [phone], with a hx of depression presents to the Emergency Department complaining of gradual, persistent, progressively worsening lower abd pain onset 4 hours ago.  Pt reports unprotected sexual contact with a female partner in August.  She reports 2 female partners in the last 6 months with intermittent condom usage. Pt denies vaginal discharge, but endorses vaginal bleeding for last several days.  Pt reports she came off the Depo-Provera shot last year and has had irregular periods since that time. She denies associated fever, chills, headache neck pain, chest pain, shortness of breath, nausea vomiting, diarrhea, weakness, dizziness, syncope, dysuria.  No erythema or alleviating factors. No treatments prior to arrival. Patient describes the pain as cramping, located in her lower abdomen. She reports that she inserted a tampon earlier today with significant pain.  She denies a history of STD or PID.  Past Medical History  Diagnosis Date  . Depression    Past Surgical History  Procedure Laterality Date  . No past surgeries     Family History  Problem Relation Age of Onset  . Anesthesia problems Neg Hx   . Hypotension Neg Hx   . Malignant hyperthermia Neg Hx   . Pseudochol deficiency Neg Hx    History  Substance Use Topics  . Smoking status: Current Every Day Smoker -- 1.00 packs/day    Types: Cigarettes  . Smokeless tobacco: Not on file  . Alcohol Use: Yes   OB History   Grav Para Term Preterm Abortions TAB SAB Ect Mult Living   0 0 0 0 0 4     Review of Systems   Constitutional: Negative for fever, diaphoresis, appetite change, fatigue and unexpected weight change.  HENT: Negative for mouth sores.   Eyes: Negative for visual disturbance.  Respiratory: Negative for cough, chest tightness, shortness of breath and wheezing.   Cardiovascular: Negative for chest pain.  Gastrointestinal: Positive for abdominal pain. Negative for nausea, vomiting, diarrhea and constipation.  Endocrine: Negative for polydipsia, polyphagia and polyuria.  Genitourinary: Positive for vaginal bleeding, vaginal discharge and pelvic pain. Negative for dysuria, urgency, frequency and hematuria.  Musculoskeletal: Negative for back pain and neck stiffness.  Skin: Negative for rash.  Allergic/Immunologic: Negative for immunocompromised state.  Neurological: Negative for syncope, light-headedness and headaches.  Hematological: Does not bruise/bleed easily.  Psychiatric/Behavioral: Negative for sleep disturbance. The patient is not nervous/anxious.       Allergies  Bee venom and Penicillins  Home Medications   Prior to Admission medications   Medication Sig Start Date End Date Taking? Authorizing Provider  doxycycline (VIBRAMYCIN) 100 MG capsule Take 1 capsule (100 mg total) by mouth 2 (two) times daily. 11/22/13   Aleen Marston, PA-C   BP 110/68  Pulse 88  Temp(Src) 98.4 F (36.9 C) (Oral)  Resp 18  SpO2 99% Physical Exam  Nursing note and vitals reviewed. Constitutional: She appears well-developed and well-nourished. No distress.  Awake, alert, nontoxic appearance  HENT:  Head: Normocephalic and atraumatic.  Mouth/Throat: Oropharynx is clear and moist. No oropharyngeal exudate.  Eyes: Conjunctivae are  normal. No scleral icterus.  Neck: Normal range of motion. Neck supple.  Cardiovascular: Normal rate, regular rhythm, normal heart sounds and intact distal pulses.   No murmur heard. No tachycardia  Pulmonary/Chest: Effort normal and breath sounds normal. No  respiratory distress. She has no wheezes.  Equal chest expansion  Abdominal: Soft. Bowel sounds are normal. She exhibits no distension and no mass. There is tenderness in the right lower quadrant, suprapubic area and left lower quadrant. There is guarding (voluntary). There is no rebound and no CVA tenderness. Hernia confirmed negative in the right inguinal area and confirmed negative in the left inguinal area.  Genitourinary: Uterus normal. No labial fusion. There is no rash, tenderness or lesion on the right labia. There is no rash, tenderness or lesion on the left labia. Uterus is not deviated, not enlarged, not fixed and not tender. Cervix exhibits motion tenderness. Cervix exhibits no discharge and no friability. Right adnexum displays tenderness. Right adnexum displays no mass and no fullness. Left adnexum displays tenderness. Left adnexum displays no mass and no fullness. There is bleeding (small amount in vaginal vault) around the vagina. No erythema or tenderness around the vagina. No foreign body around the vagina. No signs of injury around the vagina. Vaginal discharge (thick, white, moderate) found.  CMT with discharge Mild bilateral adnexal tenderness without masses  Musculoskeletal: Normal range of motion. She exhibits no edema.  Lymphadenopathy:       Right: No inguinal adenopathy present.       Left: No inguinal adenopathy present.  Neurological: She is alert. Coordination normal.  Speech is clear and goal oriented Moves extremities without ataxia  Skin: Skin is warm and dry. She is not diaphoretic. No erythema.  Psychiatric: She has a normal mood and affect.    ED Course  Procedures (including critical care time) Labs Review Labs Reviewed  WET PREP, GENITAL - Abnormal; Notable for the following:    Clue Cells Wet Prep HPF POC FEW (*)    WBC, Wet Prep HPF POC FEW (*)    All other components within normal limits  CBC - Abnormal; Notable for the following:    WBC 11.4 (*)     Hemoglobin 15.1 (*)    All other components within normal limits  BASIC METABOLIC PANEL - Abnormal; Notable for the following:    Potassium 3.6 (*)    Glucose, Bld 101 (*)    All other components within normal limits  URINALYSIS, ROUTINE W REFLEX MICROSCOPIC - Abnormal; Notable for the following:    Color, Urine AMBER (*)    APPearance CLOUDY (*)    Hgb urine dipstick MODERATE (*)    Bilirubin Urine SMALL (*)    Leukocytes, UA SMALL (*)    All other components within normal limits  URINE MICROSCOPIC-ADD ON - Abnormal; Notable for the following:    Bacteria, UA FEW (*)    All other components within normal limits  GC/CHLAMYDIA PROBE AMP  POC URINE PREG, ED  POC URINE PREG, ED    Imaging Review US Transvaginal Non-ob  11/22/2013   CLINICAL DATA:  Abdominal pain.  Rule out torsion.  Unsure LMP.  EXAM: TRANSABDOMINAL AND TRANSVAGINAL ULTRASOUND OF PELVIS  DOPPLER ULTRASOUND OF OVARIES  TECHNIQUE: Both transabdominal and transvaginal ultrasound examinations of the pelvis were performed. Transabdominal technique was performed for global imaging of the pelvis including uterus, ovaries, adnexal regions, and pelvic cul-de-sac.  It was necessary to proceed with endovaginal exam following the transabdominal exam to visualize  the uterus, endometrium, ovaries, adnexal regions. Color and duplex Doppler ultrasound was utilized to evaluate blood flow to the ovaries.  COMPARISON:  None.  FINDINGS: Uterus  Measurements: 8.8 x 4.3 x 5.9 cm. No fibroids or other mass visualized.  Endometrium  Thickness: 2.6 mm.  No focal abnormality visualized.  Right ovary  Measurements: 3.2 x 2.3 x 1.9 cm. Normal appearance/no adnexal mass.  Left ovary  Measurements: 3.1 x 1.9 x 2.1 cm. Normal appearance/no adnexal mass.  Pulsed Doppler evaluation of both ovaries demonstrates normal low-resistance arterial and venous waveforms.  Other findings  Trace free pelvic fluid  IMPRESSION: 1. Normal appearance of the uterus and ovaries.  2. No evidence for adnexal mass or ovarian torsion. 3. Trace free pelvic fluid is likely physiologic.   Electronically Signed   By: Rosalie Gums M.D.   On: 11/22/2013 01:52   US Pelvis Complete  11/22/2013   CLINICAL DATA:  Abdominal pain.  Rule out torsion.  Unsure LMP.  EXAM: TRANSABDOMINAL AND TRANSVAGINAL ULTRASOUND OF PELVIS  DOPPLER ULTRASOUND OF OVARIES  TECHNIQUE: Both transabdominal and transvaginal ultrasound examinations of the pelvis were performed. Transabdominal technique was performed for global imaging of the pelvis including uterus, ovaries, adnexal regions, and pelvic cul-de-sac.  It was necessary to proceed with endovaginal exam following the transabdominal exam to visualize the uterus, endometrium, ovaries, adnexal regions. Color and duplex Doppler ultrasound was utilized to evaluate blood flow to the ovaries.  COMPARISON:  None.  FINDINGS: Uterus  Measurements: 8.8 x 4.3 x 5.9 cm. No fibroids or other mass visualized.  Endometrium  Thickness: 2.6 mm.  No focal abnormality visualized.  Right ovary  Measurements: 3.2 x 2.3 x 1.9 cm. Normal appearance/no adnexal mass.  Left ovary  Measurements: 3.1 x 1.9 x 2.1 cm. Normal appearance/no adnexal mass.  Pulsed Doppler evaluation of both ovaries demonstrates normal low-resistance arterial and venous waveforms.  Other findings  Trace free pelvic fluid  IMPRESSION: 1. Normal appearance of the uterus and ovaries. 2. No evidence for adnexal mass or ovarian torsion. 3. Trace free pelvic fluid is likely physiologic.   Electronically Signed   By: Rosalie Gums M.D.   On: 11/22/2013 01:52   [place] Art/ven Flow Abd Pelv Doppler  11/22/2013   CLINICAL DATA:  Abdominal pain.  Rule out torsion.  Unsure LMP.  EXAM: TRANSABDOMINAL AND TRANSVAGINAL ULTRASOUND OF PELVIS  DOPPLER ULTRASOUND OF OVARIES  TECHNIQUE: Both transabdominal and transvaginal ultrasound examinations of the pelvis were performed. Transabdominal technique was performed for global imaging of the  pelvis including uterus, ovaries, adnexal regions, and pelvic cul-de-sac.  It was necessary to proceed with endovaginal exam following the transabdominal exam to visualize the uterus, endometrium, ovaries, adnexal regions. Color and duplex Doppler ultrasound was utilized to evaluate blood flow to the ovaries.  COMPARISON:  None.  FINDINGS: Uterus  Measurements: 8.8 x 4.3 x 5.9 cm. No fibroids or other mass visualized.  Endometrium  Thickness: 2.6 mm.  No focal abnormality visualized.  Right ovary  Measurements: 3.2 x 2.3 x 1.9 cm. Normal appearance/no adnexal mass.  Left ovary  Measurements: 3.1 x 1.9 x 2.1 cm. Normal appearance/no adnexal mass.  Pulsed Doppler evaluation of both ovaries demonstrates normal low-resistance arterial and venous waveforms.  Other findings  Trace free pelvic fluid  IMPRESSION: 1. Normal appearance of the uterus and ovaries. 2. No evidence for adnexal mass or ovarian torsion. 3. Trace free pelvic fluid is likely physiologic.   Electronically Signed   By: Rosalie Gums  M.D.   On: 11/22/2013 01:52     EKG Interpretation None      MDM   Final diagnoses:  PID (acute pelvic inflammatory disease)   Misty Ewing presents with lower abdominal pain. Patient has been on corporate throughout her time here in the emergency department requiring significant amounts of encouragement to become chronic, obtain pelvic exam, blood work et Karie Soda.  Exam patient with blood in the vaginal vault in addition to vaginal discharge. Cervical motion tenderness; concern for PID.  Pregnancy test negative (not crossed over from POC lab).  2:16 AM Patient with mild of the psychosis 11.4 labs otherwise reassuring. Urinalysis with small leukocytes but no other evidence of infection.  Ultrasound evidence of ovarian torsion, mass or ectopic pregnancy.   Patient treated for STDs here in the emergency department and will be discharged home with doxycycline.  Patient has gonorrhea and Chlamydia culture  pending. She is to followup with OB/GYN in 3 days. She is to return to the emergency department for worsening pain, high fevers retractable vomiting. Patient is afebrile, non-tachycardic and well appearing. She does not meet Sirs or sepsis criteria.  I believe she can be treated adequately on an outpatient basis.  I have personally reviewed patient's vitals, nursing note and any pertinent labs or imaging.  I performed an undressed physical exam.    It has been determined that no acute conditions requiring further emergency intervention are present at this time. The patient/guardian have been advised of the diagnosis and plan. I reviewed all labs and imaging including any potential incidental findings.   Vital signs are stable at discharge.   BP 110/68  Pulse 88  Temp(Src) 98.4 F (36.9 C) (Oral)  Resp 18  SpO2 99%        Dierdre Forth, PA-C 11/22/13 [phone]

## 2013-11-21 NOTE — ED Notes (Signed)
Pt refused IVF.

## 2013-11-21 NOTE — ED Notes (Signed)
Bed: WA01 Expected date: 11/21/13 Expected time: 8:24 PM Means of arrival: Ambulance Comments: 23 yo F  Lower abd pain

## 2013-11-21 NOTE — ED Notes (Signed)
Patient refuses to change into gown for exam and refuses pelvic. Attending will be notified.

## 2013-11-21 NOTE — ED Notes (Signed)
Patient has had abdominal pain for 4 hours. Denies pregnancy but has not had a menstrual period since stopping birth control.

## 2013-11-21 NOTE — Progress Notes (Signed)
  CARE MANAGEMENT ED NOTE 11/21/2013  Patient:  Misty Ewing, Misty Ewing   Account Number:  [id number]  Date Initiated:  11/21/2013  Documentation initiated by:  Radford Pax  Subjective/Objective Assessment:   Patient presents to ED with abdominal pian for four hours     Subjective/Objective Assessment Detail:     Action/Plan:   Action/Plan Detail:   Anticipated DC Date:       Status Recommendation to Physician:   Result of Recommendation:    Other ED Services  Consult Working Plan    DC Planning Services  Other  PCP issues    Choice offered to / List presented to:            Status of service:  Completed, signed off  ED Comments:   ED Comments Detail:  EDCM spoke to patient at bedside.  Patient reports she does not have a pcp.  [facility] asked patient if she would like a list of pcps who accept [employer]?  Patient reports she has a list at  home.  [facility] asked patient if there was a doctor listed on her Medicaid card?  Patient showed [facility] her insurance card and has [facility] listed as her pcp.  EDCM instructed patient that that is where she needs to go for pcp visits.  EDCNM infomed patient that if she wanted to change her pcp she would have to call the DSS.  Patient verbalized understanding.  system updated.  No further EDCM needs at this time.

## 2013-11-22 ENCOUNTER — Emergency Department (HOSPITAL_COMMUNITY): Payer: Medicaid Other

## 2013-11-22 LAB — GC/CHLAMYDIA PROBE AMP
CT Probe RNA: NEGATIVE
GC Probe RNA: NEGATIVE

## 2013-11-22 MED ORDER — DOXYCYCLINE HYCLATE 100 MG PO CAPS: 100.0000 mg | ORAL_CAPSULE | Freq: Two times a day (BID) | ORAL | Status: DC

## 2013-11-22 MED ORDER — DOXYCYCLINE HYCLATE 100 MG PO TABS
100.0000 mg | ORAL_TABLET | Freq: Once | ORAL | Status: AC
  Administered 2013-11-22: 100 mg via ORAL
  Filled 2013-11-22: qty 1

## 2013-11-22 MED ORDER — AZITHROMYCIN 250 MG PO TABS
1000.0000 mg | ORAL_TABLET | Freq: Once | ORAL | Status: AC
  Administered 2013-11-22: 1000 mg via ORAL
  Filled 2013-11-22: qty 4

## 2013-11-22 MED ORDER — METRONIDAZOLE 500 MG PO TABS
2000.0000 mg | ORAL_TABLET | Freq: Once | ORAL | Status: AC
Start: 2013-11-22 — End: 2013-11-22
  Administered 2013-11-22: 2000 mg via ORAL
  Filled 2013-11-22: qty 4

## 2013-11-22 MED ORDER — LIDOCAINE HCL 1 % IJ SOLN
INTRAMUSCULAR | Status: AC
Start: 2013-11-22 — End: 2013-11-22
  Administered 2013-11-22: 20 mL
  Filled 2013-11-22: qty 20

## 2013-11-22 MED ORDER — CEFTRIAXONE SODIUM 250 MG IJ SOLR
250.0000 mg | Freq: Once | INTRAMUSCULAR | Status: AC
  Administered 2013-11-22: 250 mg via INTRAMUSCULAR
  Filled 2013-11-22: qty 250

## 2013-11-22 NOTE — Discharge Instructions (Signed)
1. Medications: doxycycline, usual home medications 2. Treatment: rest, drink plenty of fluids, take all of her medication as directed 3. Follow Up: Please followup with your women's outpatient clinic for discussion of your diagnoses and further evaluation after today's visit; if you do not have a primary care doctor use the resource guide provided to find one;     Pelvic Inflammatory Disease Pelvic inflammatory disease (PID) refers to an infection in some or all of the female organs. The infection can be in the uterus, ovaries, fallopian tubes, or the surrounding tissues in the pelvis. PID can cause abdominal or pelvic pain that comes on suddenly (acute pelvic pain). PID is a serious infection because it can lead to lasting (chronic) pelvic pain or the inability to have children (infertile).  CAUSES  The infection is often caused by the normal bacteria found in the vaginal tissues. PID may also be caused by an infection that is spread during sexual contact. PID can also occur following:   The birth of a baby.   A miscarriage.   An abortion.   Major pelvic surgery.   The use of an intrauterine device (IUD).   A sexual assault.  RISK FACTORS Certain factors can put a person at higher risk for PID, such as:  Being younger than 25 years.  Being sexually active at [place] age.  Usingnonbarrier contraception.  Havingmultiple sexual partners.  Having sex with someone who has symptoms of a genital infection.  Using oral contraception. Other times, certain behaviors can increase the possibility of getting PID, such as:  Having sex during your period.  Using a vaginal douche.  Having an intrauterine device (IUD) in place. SYMPTOMS   Abdominal or pelvic pain.   Fever.   Chills.   Abnormal vaginal discharge.  Abnormal uterine bleeding.   Unusual pain shortly after finishing your period. DIAGNOSIS  Your caregiver will choose some of the following methods to  make a diagnosis, such as:   Performinga physical exam and history. A pelvic exam typically reveals a very tender uterus and surrounding pelvis.   Ordering laboratory tests including a pregnancy test, blood tests, and urine test.  Orderingcultures of the vagina and cervix to check for a sexually transmitted infection (STI).  Performing an ultrasound.   Performing a laparoscopic procedure to look inside the pelvis.  TREATMENT   Antibiotic medicines may be prescribed and taken by mouth.   Sexual partners may be treated when the infection is caused by a sexually transmitted disease (STD).   Hospitalization may be needed to give antibiotics intravenously.  Surgery may be needed, but this is rare. It may take weeks until you are completely well. If you are diagnosed with PID, you should also be checked for human immunodeficiency virus (HIV). HOME CARE INSTRUCTIONS   If given, take your antibiotics as directed. Finish the medicine even if you start to feel better.   Only take over-the-counter or prescription medicines for pain, discomfort, or fever as directed by your caregiver.   Do not have sexual intercourse until treatment is completed or as directed by your caregiver. If PID is confirmed, your recent sexual partner(s) will need treatment.   Keep your follow-up appointments. SEEK MEDICAL CARE IF:   You have increased or abnormal vaginal discharge.   You need prescription medicine for your pain.   You vomit.   You cannot take your medicines.   Your partner has an STD.  SEEK IMMEDIATE MEDICAL CARE IF:   You have a fever.  You have increased abdominal or pelvic pain.   You have chills.   You have pain when you urinate.   You are not better after 72 hours following treatment.  MAKE SURE YOU:   Understand these instructions.  Will watch your condition.  Will get help right away if you are not doing well or get worse. Document Released:  02/24/2005 Document Revised: 06/21/2012 Document Reviewed: 02/20/2011 [facility] Patient Information 2015 [place], [place]. This information is not intended to replace advice given to you by your health care provider. Make sure you discuss any questions you have with your health care provider.

## 2013-11-25 NOTE — ED Provider Notes (Signed)
Medical screening examination/treatment/procedure(s) were performed by non-physician practitioner and as supervising physician I was immediately available for consultation/collaboration.   EKG Interpretation None        Candyce Churn III, MD 11/25/13 [phone]

## 2014-01-09 ENCOUNTER — Encounter (HOSPITAL_COMMUNITY): Payer: Self-pay | Admitting: Emergency Medicine

## 2014-04-01 ENCOUNTER — Encounter (HOSPITAL_COMMUNITY): Payer: Self-pay | Admitting: *Deleted

## 2014-04-01 ENCOUNTER — Emergency Department (HOSPITAL_COMMUNITY)
Admission: EM | Admit: 2014-04-01 | Discharge: 2014-04-01 | Disposition: A | Discharge status: Home / Self Care | Payer: Medicaid Other | Attending: Emergency Medicine | Admitting: Emergency Medicine

## 2014-04-01 DIAGNOSIS — Z8659 Personal history of other mental and behavioral disorders: Secondary | ICD-10-CM | POA: Insufficient documentation

## 2014-04-01 DIAGNOSIS — Z792 Long term (current) use of antibiotics: Secondary | ICD-10-CM | POA: Insufficient documentation

## 2014-04-01 DIAGNOSIS — Z720 Tobacco use: Secondary | ICD-10-CM | POA: Insufficient documentation

## 2014-04-01 DIAGNOSIS — K008 Other disorders of tooth development: Secondary | ICD-10-CM | POA: Insufficient documentation

## 2014-04-01 DIAGNOSIS — R6884 Jaw pain: Secondary | ICD-10-CM | POA: Diagnosis present

## 2014-04-01 DIAGNOSIS — K0889 Other specified disorders of teeth and supporting structures: Secondary | ICD-10-CM

## 2014-04-01 DIAGNOSIS — K047 Periapical abscess without sinus: Secondary | ICD-10-CM | POA: Diagnosis not present

## 2014-04-01 MED ORDER — TRAMADOL HCL 50 MG PO TABS: 50.0000 mg | ORAL_TABLET | Freq: Four times a day (QID) | ORAL | Status: DC | PRN

## 2014-04-01 MED ORDER — CLINDAMYCIN HCL 150 MG PO CAPS: 300.0000 mg | ORAL_CAPSULE | Freq: Three times a day (TID) | ORAL | Status: DC

## 2014-04-01 NOTE — ED Provider Notes (Signed)
CSN: [id number]     Arrival date & time 04/01/14  1618 History  This chart was scribed for non-physician practitioner, Garlon HatchetLisa M Giorgio Chabot, PA-C, working with Toy CookeyMegan Docherty, MD, by Modena JanskyAlbert Thayil, ED Scribe. This patient was seen in room TR09C/TR09C and the patient's care was started at 4:41 PM.  Chief Complaint  Patient presents with  . Jaw Pain   The history is provided by the patient. No language interpreter was used.   HPI Comments: Misty Ewing is a 24 y.o. female who presents to the Emergency Department complaining of a constant moderate right upper and lower jaw pain. She reports that the top and the bottom of her jaw hurts with some facial swelling on her right cheek.  No neck swelling, difficulty swallowing, or SOB.  No fever, chills.  Patient not currently established with dentist.  No intervention tried PTA.  Past Medical History  Diagnosis Date  . Depression    Past Surgical History  Procedure Laterality Date  . No past surgeries     Family History  Problem Relation Age of Onset  . Anesthesia problems Neg Hx   . Hypotension Neg Hx   . Malignant hyperthermia Neg Hx   . Pseudochol deficiency Neg Hx    History  Substance Use Topics  . Smoking status: Current Every Day Smoker -- 1.00 packs/day    Types: Cigarettes  . Smokeless tobacco: Not on file  . Alcohol Use: Yes   OB History    Gravida Para Term Preterm AB TAB SAB Ectopic Multiple Living   4 4 3 1  0 0 0 0 0 4     Review of Systems  HENT: Positive for dental problem and facial swelling.   All other systems reviewed and are negative.     Allergies  Bee venom and Penicillins  Home Medications   Prior to Admission medications   Medication Sig Start Date End Date Taking? Authorizing Provider  doxycycline (VIBRAMYCIN) 100 MG capsule Take 1 capsule (100 mg total) by mouth 2 (two) times daily. 11/22/13   Hannah Muthersbaugh, PA-C   BP 110/65 mmHg  Pulse 89  Temp(Src) 97.9 F (36.6 C) (Oral)  Resp 18   Ht 5\' 4"  (1.626 m)  Wt 135 lb (61.236 kg)  BMI 23.16 kg/m2  SpO2 99%  LMP 03/25/2014 Physical Exam  Constitutional: She is oriented to person, place, and time. She appears well-developed and well-nourished.  HENT:  Head: Normocephalic and atraumatic.  Mouth/Throat: Oropharynx is clear and moist.  Teeth largely in fair dentition, right upper and lower molars broken with cavities present; surrounding gingiva swollen and erythematous without fluctuance or appreciable abscess formation; mild right cheek swelling without extension into neck; handling secretions appropriately, no trismus  Eyes: Conjunctivae and EOM are normal. Pupils are equal, round, and reactive to light.  Neck: Normal range of motion.  Cardiovascular: Normal rate, regular rhythm and normal heart sounds.   Pulmonary/Chest: Effort normal and breath sounds normal. No respiratory distress. She has no wheezes.  Musculoskeletal: Normal range of motion.  Neurological: She is alert and oriented to person, place, and time.  Skin: Skin is warm and dry.  Psychiatric: She has a normal mood and affect.  Nursing note and vitals reviewed.   ED Course  Procedures (including critical care time) DIAGNOSTIC STUDIES: Oxygen Saturation is 99% on RA, normal by my interpretation.    COORDINATION OF CARE: 4:45 PM- Pt advised of plan for treatment which includes medication and pt agrees.  Labs Review  Labs Reviewed - No data to display  Imaging Review No results found.   EKG Interpretation None      MDM   Final diagnoses:  Dental abscess  Pain, dental   Dental pain with likely developing abscess. Patient afebrile and nontoxic, airway patent and handling secretions well. She'll be started on antibiotics and pain medicine. She is instructed to follow with dentist, referrals were provided.  Discussed plan with patient, he/she acknowledged understanding and agreed with plan of care.  Return precautions given for new or worsening  symptoms.  I personally performed the services described in this documentation, which was scribed in my presence. The recorded information has been reviewed and is accurate.  Garlon Hatchet, PA-C 04/01/14 1659  Toy Cookey, MD 04/01/14 2128

## 2014-04-01 NOTE — Discharge Instructions (Signed)
Take the prescribed medication as directed. Follow-up with dentist.  referral and resource guide provided. Return to the ED for new or worsening symptoms.   Emergency Department Resource Guide 1) Find a Doctor and Pay Out of Pocket Although you won't have to find out who is covered by your insurance plan, it is a good idea to ask around and get recommendations. You will then need to call the office and see if the doctor you have chosen will accept you as a new patient and what types of options they offer for patients who are self-pay. Some doctors offer discounts or will set up payment plans for their patients who do not have insurance, but you will need to ask so you aren't surprised when you get to your appointment.  2) Contact Your Local Health Department Not all health departments have doctors that can see patients for sick visits, but many do, so it is worth a call to see if yours does. If you don't know where your local health department is, you can check in your phone book. The CDC also has a tool to help you locate your state's health department, and many state websites also have listings of all of their local health departments.  3) Find a Walk-in Clinic If your illness is not likely to be very severe or complicated, you may want to try a walk in clinic. These are popping up all over the country in pharmacies, drugstores, and shopping centers. They're usually staffed by nurse practitioners or physician assistants that have been trained to treat common illnesses and complaints. They're usually fairly quick and inexpensive. However, if you have serious medical issues or chronic medical problems, these are probably not your best option.  No Primary Care Doctor: - Call Health Connect at  [phone] - they can help you locate a primary care doctor that  accepts your insurance, provides certain services, etc. - Physician Referral Service- [phone]  Chronic Pain Problems: Organization          Address  Phone   Notes  [facility]  [phone] Patients need to be referred by their primary care doctor.   Medication Assistance: Organization         Address  Phone   Notes  [facility] [address] [place], [place] [address] [phone] --Must be a resident of [facility] -- Must have NO insurance coverage whatsoever (no Medicaid/ Medicare, etc.) -- The pt. MUST have a primary care doctor that directs their care regularly and follows them in the community   MedAssist  [phone]   [employer]  [phone]    Agencies that provide inexpensive medical care: Organization         Address  Phone   Notes  [facility]  [phone]   [facility]    [phone]   [facility] [address] [place], [place] [address] [phone]   [facility] [address], [place] [phone]   [facility]    [phone]   [facility]    [phone]   [facility]  [address], [place] Phone:  [phone], Fax:  [phone] Hours of Operation:  9 am - 6 pm, M-F.  Also accepts Medicaid/Medicare and self-pay.  [facility]  [address], [place] Phone: [phone], Fax: [phone]. Hours of Operation:  8:30 am - 5:30 pm, M-F.  Also accepts Medicaid and self-pay.  [facility] [place] [address] [place] Phone: [phone]   [place], [place], [place] [phone], Ext. [phone] Mondays & Thursdays: 7-9 AM.  First 15 patients are seen on a first come, first serve basis.    [place] Providers:  Organization         Address  Phone   Notes  [facility] [address], [place] [phone] Also accepts self-pay patients.    [facility] [address], [place]  [phone]   [place], [address], [place] [phone]   [facility] [address] [place] [phone]   Lucianne Lei [address], [place]   [phone] Only accepts [place] Access [place] patients after they have their name applied to their card.   Self-Pay (no insurance) in [facility]:  Organization         Address  Phone   Notes  Sickle Cell Patients, [facility] Internal [facility] [place] [phone]   [facility] [place] [phone]   [facility] [place], [place],  [phone]   [facility]/Dr. Osei-Bonsu  [address], [place] [address], [place] [phone] Phone number for both [place] and [place] locations is the same.  [facility] [address] [place] [phone]   [facility] [address], [place] or [address] [phone]   [facility] [address] [place] [phone], phone; [phone], fax Sees patients 1st and 3rd Saturday of every month.  Must not qualify for public or private insurance (i.e. Medicaid, Medicare, [place] Health Choice, Veterans' Benefits)  Household income should be no more than 200% of the poverty level The clinic cannot treat you if you are pregnant or think you are pregnant  Sexually transmitted diseases are not treated at the clinic.    Dental Care: Organization         Address  Phone  Notes  [facility] [place] [phone] Accepts children up to age 76 who are enrolled in [place] or [place]; pregnant women with a Medicaid  card; and children who have applied for Medicaid or [place] Health Choice, but were declined, whose parents can pay a reduced fee at time of service.  [facility]  [address], [place] [phone] Accepts children up to age 27 who are enrolled in [place] or [place]; pregnant women with a Medicaid card; and children who have applied for Medicaid or [place] Health Choice, but were declined, whose parents can pay a reduced fee at time of service.  [facility]  [place] [phone] Patients are seen by appointment only. Walk-ins are not accepted. [place] will see patients 26 years of age and older. Monday - Tuesday (8am-5pm) Most Wednesdays (8:30-5pm) $30 per visit, cash only  [place]  [address], [facility] [phone] Patients are seen by appointment only. Walk-ins are not accepted. [facility] will see patients 27 years of age and older. One Wednesday Evening (Monthly: Volunteer Based).  $30 per visit, cash only  [place]  [phone] for adults; Children under age 9, call Graduate Pediatric Dentistry at [phone]. Children aged 47-14, please call [phone] to request a pediatric application.  Dental services are provided in all areas of dental care including fillings, crowns and bridges, complete and partial dentures, implants, gum treatment, root canals, and extractions. Preventive care is also provided. Treatment is provided to both adults and children. Patients are selected via a lottery and there is often a waiting list.   [facility] [address], [place]  [phone] [email or web address]   [facility] [address] [place], [place] [phone], Ext. [phone] Second and Fourth Thursday of each month, opens at 6:30 AM; Clinic ends at 9 AM.  Patients are seen on a first-come  first-served basis, and a limited number are seen during each clinic.   [facility]  [address] [place], [place] [phone]   Eligibility Requirements You must have lived in [place], [place], or [place] counties for at least the last three months.   You cannot be eligible for state or federal sponsored [employer], including [employer], [place], or [employer].   You generally cannot be eligible for healthcare insurance through your employer.    How to apply: Eligibility screenings are held every Tuesday and Wednesday afternoon from 1:00 pm until 4:00 pm. You do not need an appointment for the interview!  [facility] [address] [place], [place]   [place]  [place] Department  [place]  [phone]    Behavioral Health Resources in the Community: Intensive Outpatient Programs Organization         Address  Phone  Notes  [place]. [address] [place], [place] [phone]   [facility] Outpatient [address] [place], [place]   [facility]: [facility] [address] [place], [place]   [place] [address],  [place], [place] or [phone]   Substance Abuse Resources Organization         Address  Phone  Notes  [facility]  [phone]   [place]  [phone]   The [place]   Chinita Pester  [phone]   Residential & Outpatient Substance Abuse Program  [phone]   Psychological Services Organization         Address  Phone  Notes  [facility] [place]  [place]  [phone]   [place] [address], [place] or [phone]    Mobile Crisis Teams Organization          Address  Phone  Notes  [facility], Mobile Crisis Care Unit  [phone]   [facility]  [address] [place], [place]   [place] Levels [address] [place] [phone]    Self-Help/Support Groups Organization         Address  Phone             Notes  [place]. of  - variety of  support groups  [phone]- [phone] Call for more information  °Narcotics Anonymous (NA), [facility] [address], °[place]  2 meetings at this location  ° °Residential Treatment Programs °Organization         Address  Phone  Notes  °[facility] [address],    °[place]  [phone]   °[facility] ° [address], [place], [place] [phone]   °[facility] [address], [place] [phone] Admissions: 8am-3pm M-F  °[facility] [address]    °[place], [place] [phone]   °[facility] [address], [place], [place] [phone]   °[facility] [address]  °[place], [place] [phone]   °[facility] - Intensive Outpatient [address], [place], [place] [phone]   °[facility] [address]  °[place], [place] [phone] or [phone]   °[facility] ([facility]) [address] [place], [place] [phone] Accepts Medicaid  °[facility] [address]  °[place] 1-800-659-3381 Substance Abuse/Addiction Treatment  ° °[place] Behavioral Health Resources °Organization         Address  Phone  Notes  °[facility]  ([phone]) [phone]   °Julie Brannon, PhD [address] [place], [place]   ([phone]) [phone] or (336) [phone]   °[facility]   [address] °[place], [place] ([phone]) [phone]   °[facility] [address], [place], [place] ([phone]) [phone] Insurance/Medicaid/sponsorship  through [facility]  °[facility] [address]                                    [place], [place] [phone]) [phone] Therapy/tele-psych/case  °[facility] [address]  ° [place], [place] ([phone]) [phone]    °Dr. Arfeen  ([phone]) [phone]   °[facility] [place]  [employer] [place] [facility] 1) [address], [place] °2) [address], [place] °3)  [address], [place] ([phone]) [phone] °(336) [phone] ° °(336) [phone]   °[place] Child Abuse Hotline (336) 342-1394 or (336) [phone] (After Hours)    ° ° ° °

## 2014-04-01 NOTE — ED Notes (Signed)
Declined W/C at D/C and was escorted to lobby by RN. 

## 2014-07-27 ENCOUNTER — Emergency Department (HOSPITAL_COMMUNITY)
Admission: EM | Admit: 2014-07-27 | Discharge: 2014-07-27 | Disposition: A | Discharge status: Home / Self Care | Payer: Medicaid Other | Attending: Emergency Medicine | Admitting: Emergency Medicine

## 2014-07-27 ENCOUNTER — Encounter (HOSPITAL_COMMUNITY): Payer: Self-pay | Admitting: Emergency Medicine

## 2014-07-27 ENCOUNTER — Emergency Department (HOSPITAL_COMMUNITY): Payer: Medicaid Other

## 2014-07-27 DIAGNOSIS — Z792 Long term (current) use of antibiotics: Secondary | ICD-10-CM | POA: Insufficient documentation

## 2014-07-27 DIAGNOSIS — N939 Abnormal uterine and vaginal bleeding, unspecified: Secondary | ICD-10-CM | POA: Diagnosis not present

## 2014-07-27 DIAGNOSIS — A5901 Trichomonal vulvovaginitis: Secondary | ICD-10-CM

## 2014-07-27 DIAGNOSIS — Z880 Allergy status to penicillin: Secondary | ICD-10-CM | POA: Diagnosis not present

## 2014-07-27 DIAGNOSIS — Z3202 Encounter for pregnancy test, result negative: Secondary | ICD-10-CM | POA: Diagnosis not present

## 2014-07-27 DIAGNOSIS — F329 Major depressive disorder, single episode, unspecified: Secondary | ICD-10-CM | POA: Insufficient documentation

## 2014-07-27 DIAGNOSIS — Z720 Tobacco use: Secondary | ICD-10-CM | POA: Diagnosis not present

## 2014-07-27 LAB — WET PREP, GENITAL: YEAST WET PREP: NONE SEEN

## 2014-07-27 LAB — CBC
HCT: 39.6 % (ref 36.0–46.0)
Hemoglobin: 13.4 g/dL (ref 12.0–15.0)
MCH: 30.5 pg (ref 26.0–34.0)
MCHC: 33.8 g/dL (ref 30.0–36.0)
MCV: 90 fL (ref 78.0–100.0)
PLATELETS: 241 10*3/uL (ref 150–400)
RBC: 4.4 MIL/uL (ref 3.87–5.11)
RDW: 13.4 % (ref 11.5–15.5)
WBC: 6.4 10*3/uL (ref 4.0–10.5)

## 2014-07-27 LAB — POC URINE PREG, ED: PREG TEST UR: NEGATIVE

## 2014-07-27 MED ORDER — MEGESTROL ACETATE 40 MG PO TABS: ORAL_TABLET | ORAL | Status: DC

## 2014-07-27 MED ORDER — METRONIDAZOLE 500 MG PO TABS: 500.0000 mg | ORAL_TABLET | Freq: Two times a day (BID) | ORAL | Status: DC

## 2014-07-27 NOTE — ED Provider Notes (Signed)
CSN: [id number]     Arrival date & time 07/27/14  1635 History  This chart was scribed for Theda Clark Med Ctrope M Neese, NP, working with Arby BarretteMarcy Pfeiffer, MD by Elon SpannerGarrett Cook, ED Scribe. This patient was seen in room TR02C/TR02C and the patient's care was started at 5:22 PM.   Chief Complaint  Patient presents with  . Vaginal Bleeding   The history is provided by the patient. No language interpreter was used.   HPI Comments: Misty Ewing is a 24 y.o. female who presents to the Emergency Department complaining of vaginal bleeding with associated abdominal cramping, nausea, headache, unintended weight loss onset 2 months ago.  Her bleeding has been heavier than her normal period and she has used an average of 10 maxi pads/day since onset.  The abdominal cramping resolved with use of Excedrin but her other complaints have persisted.  She reports one long-term, female sexual partner. She denies previous history of abnormal bleeding, menstruation.  Patient reports [facility] at which time she was diagnosed PID.   Her last pap smear was > 2 years ago.  LNMP 2 months ago.  She denies abdominal pain, n/v, fever, chills.  G4P4A0  Her current sex partner is female.   [facility]   Past Medical History  Diagnosis Date  . Depression    Past Surgical History  Procedure Laterality Date  . No past surgeries     Family History  Problem Relation Age of Onset  . Anesthesia problems Neg Hx   . Hypotension Neg Hx   . Malignant hyperthermia Neg Hx   . Pseudochol deficiency Neg Hx    History  Substance Use Topics  . Smoking status: Current Every Day Smoker -- 1.00 packs/day    Types: Cigarettes  . Smokeless tobacco: Not on file  . Alcohol Use: Yes   OB History    Gravida Para Term Preterm AB TAB SAB Ectopic Multiple Living   4 4 3 1  0 0 0 0 0 4     Review of Systems  Constitutional: Positive for unexpected weight change.  Gastrointestinal:  Abdominal pain: cramping.  Genitourinary: Positive for vaginal bleeding and vaginal discharge.  Neurological: Headaches: occasional.  all other systems negative    Allergies  Bee venom and Penicillins  Home Medications   Prior to Admission medications   Medication Sig Start Date End Date Taking? Authorizing Provider  clindamycin (CLEOCIN) 150 MG capsule Take 2 capsules (300 mg total) by mouth 3 (three) times daily. May dispense as 150mg  capsules 04/01/14   Garlon HatchetLisa M Sanders, PA-C  doxycycline (VIBRAMYCIN) 100 MG capsule Take 1 capsule (100 mg total) by mouth 2 (two) times daily. 11/22/13   Hannah Muthersbaugh, PA-C  megestrol (MEGACE) 40 MG tablet Take two tablets (40 mg) three times per day time three days,  then take two tablets (40 mg) two times per day time three days,  then take two tablets (40 mg)once per day 07/27/14   Janne NapoleonHope M Neese, NP  metroNIDAZOLE (FLAGYL) 500 MG tablet Take 1 tablet (500 mg total) by mouth 2 (two) times daily. 07/27/14   Hope Orlene OchM Neese, NP  traMADol (ULTRAM) 50 MG tablet Take 1 tablet (50 mg total) by mouth every 6 (six) hours as needed. 04/01/14   Garlon HatchetLisa M Sanders, PA-C   BP 107/66 mmHg  Pulse 79  Temp(Src) 98.3 F (36.8 C) (Oral)  Resp 16  Wt 114 lb 5 oz (51.852 kg)  SpO2 100%  LMP 06/27/2014 Physical Exam  Constitutional: She is oriented to person, place, and time. She appears well-developed and well-nourished. No distress.  HENT:  Head: Normocephalic and atraumatic.  Eyes: Conjunctivae and EOM are normal.  Neck: Neck supple. No tracheal deviation present.  Cardiovascular: Normal rate and regular rhythm.   Pulmonary/Chest: Effort normal. No respiratory distress.  Abdominal: Soft. There is no tenderness.  Genitourinary:  External genitalia without lesions, moderate bleeding with frothy d/c vaginal vault. Mild CMT, cervix inflamed, no adnexal tenderness or mass palpated. Uterus without palpable enlargement.   Musculoskeletal: Normal range of motion.   Neurological: She is alert and oriented to person, place, and time.  Skin: Skin is warm and dry.  Psychiatric: She has a normal mood and affect. Her behavior is normal.  Nursing note and vitals reviewed.   ED Course  Procedures (including critical care time) Results for orders placed or performed during the hospital encounter of 07/27/14 (from the past 24 hour(s))  POC urine preg, ED (not at [facility])     Status: None   Collection Time: 07/27/14  5:33 PM  Result Value Ref Range   Preg Test, Ur NEGATIVE NEGATIVE  Wet prep, genital     Status: Abnormal   Collection Time: 07/27/14  5:43 PM  Result Value Ref Range   Yeast Wet Prep HPF POC NONE SEEN NONE SEEN   Trich, Wet Prep FEW (A) NONE SEEN   Clue Cells Wet Prep HPF POC MANY (A) NONE SEEN   WBC, Wet Prep HPF POC FEW (A) NONE SEEN  CBC     Status: None   Collection Time: 07/27/14  5:52 PM  Result Value Ref Range   WBC 6.4 4.0 - 10.5 K/uL   RBC 4.40 3.87 - 5.11 MIL/uL   Hemoglobin 13.4 12.0 - 15.0 g/dL   HCT 95.639.6 21.336.0 - 08.646.0 %   MCV 90.0 78.0 - 100.0 fL   MCH 30.5 26.0 - 34.0 pg   MCHC 33.8 30.0 - 36.0 g/dL   RDW 57.813.4 46.911.5 - 62.915.5 %   Platelets 241 150 - 400 K/uL  HIV antibody     Status: None   Collection Time: 07/27/14  5:52 PM  Result Value Ref Range   HIV Screen 4th Generation wRfx Non Reactive Non Reactive  RPR     Status: None   Collection Time: 07/27/14  5:52 PM  Result Value Ref Range   RPR Ser Ql Non Reactive Non Reactive     DIAGNOSTIC STUDIES: Oxygen Saturation is 99% on RA, normal by my interpretation.    COORDINATION OF CARE:  5:29 PM Discussed treatment plan with patient at bedside.  Patient acknowledges and agrees with plan.   Discussed ultrasound with the patient but she does not want to wait to have one done. She will follow up with the GYN Clinic for further evaluation.   MDM  24 y.o. female with abnormal vaginal bleeding and trichomonas infection. Will treat with Megace for bleeding and Flagyl for  trichomonas. Patient stable for d/c to f/u with GYN Clinic. Discussed with the patient clinical and lab findings and plan of care. All questioned fully answered.   Final diagnoses:  Abnormal vaginal bleeding  Trichomonas vaginitis   I personally performed the services described in this documentation, which was scribed in my presence. The recorded information has been reviewed and is accurate.    [place] Ranch ColonyHope M Neese, NP 07/28/14 1137  Arby BarretteMarcy Pfeiffer, MD 08/02/14 [phone]

## 2014-07-27 NOTE — ED Notes (Signed)
Pt. Waiting on US.

## 2014-07-27 NOTE — Discharge Instructions (Signed)
Abnormal Uterine Bleeding Abnormal uterine bleeding can affect women at various stages in life, including teenagers, women in their reproductive years, pregnant women, and women who have reached menopause. Several kinds of uterine bleeding are considered abnormal, including:  Bleeding or spotting between periods.   Bleeding after sexual intercourse.   Bleeding that is heavier or more than normal.   Periods that last longer than usual.  Bleeding after menopause.  Many cases of abnormal uterine bleeding are minor and simple to treat, while others are more serious. Any type of abnormal bleeding should be evaluated by your health care provider. Treatment will depend on the cause of the bleeding. HOME CARE INSTRUCTIONS Monitor your condition for any changes. The following actions may help to alleviate any discomfort you are experiencing:  Avoid the use of tampons and douches as directed by your health care provider.  Change your pads frequently. You should get regular pelvic exams and Pap tests. Keep all follow-up appointments for diagnostic tests as directed by your health care provider.  SEEK MEDICAL CARE IF:   Your bleeding lasts more than 1 week.   You feel dizzy at times.  SEEK IMMEDIATE MEDICAL CARE IF:   You pass out.   You are changing pads every 15 to 30 minutes.   You have abdominal pain.  You have a fever.   You become sweaty or weak.   You are passing large blood clots from the vagina.   You start to feel nauseous and vomit. MAKE SURE YOU:   Understand these instructions.  Will watch your condition.  Will get help right away if you are not doing well or get worse. Document Released: 02/24/2005 Document Revised: 03/01/2013 Document Reviewed: 09/23/2012 ExitCare Patient Information 2015 [employer]. This information is not intended to replace advice given to you by your health care provider. Make sure you discuss any questions you have with your  health care provider.  

## 2014-07-27 NOTE — ED Notes (Signed)
Patient is alert and orientedx4.  Patient was explained discharge instructions and they understood them with no questions.   

## 2014-07-27 NOTE — ED Notes (Signed)
Patient is resting comfortably. 

## 2014-07-27 NOTE — ED Notes (Signed)
Pt. Stated, I've been having vaginal bleeding for a month and my head has been hurting since I've been bleeding. NO  OBGYN doctor.  Its making me feel bad all over for a month.

## 2014-07-28 LAB — RPR: RPR: NONREACTIVE

## 2014-07-28 LAB — GC/CHLAMYDIA PROBE AMP (~~LOC~~) NOT AT ARMC
Chlamydia: NEGATIVE
Neisseria Gonorrhea: NEGATIVE

## 2014-07-28 LAB — HIV ANTIBODY (ROUTINE TESTING W REFLEX): HIV Screen 4th Generation wRfx: NONREACTIVE

## 2015-04-15 ENCOUNTER — Emergency Department (HOSPITAL_COMMUNITY)
Admission: EM | Admit: 2015-04-15 | Discharge: 2015-04-16 | Disposition: A | Discharge status: Home / Self Care | Payer: No Typology Code available for payment source | Attending: Emergency Medicine | Admitting: Emergency Medicine

## 2015-04-15 ENCOUNTER — Encounter (HOSPITAL_COMMUNITY): Payer: Self-pay | Admitting: *Deleted

## 2015-04-15 DIAGNOSIS — Y998 Other external cause status: Secondary | ICD-10-CM | POA: Insufficient documentation

## 2015-04-15 DIAGNOSIS — F329 Major depressive disorder, single episode, unspecified: Secondary | ICD-10-CM | POA: Insufficient documentation

## 2015-04-15 DIAGNOSIS — Z880 Allergy status to penicillin: Secondary | ICD-10-CM | POA: Insufficient documentation

## 2015-04-15 DIAGNOSIS — Y92410 Unspecified street and highway as the place of occurrence of the external cause: Secondary | ICD-10-CM | POA: Insufficient documentation

## 2015-04-15 DIAGNOSIS — Z711 Person with feared health complaint in whom no diagnosis is made: Secondary | ICD-10-CM | POA: Diagnosis not present

## 2015-04-15 DIAGNOSIS — Z0000 Encounter for general adult medical examination without abnormal findings: Secondary | ICD-10-CM

## 2015-04-15 DIAGNOSIS — S0990XA Unspecified injury of head, initial encounter: Secondary | ICD-10-CM | POA: Diagnosis present

## 2015-04-15 DIAGNOSIS — F17210 Nicotine dependence, cigarettes, uncomplicated: Secondary | ICD-10-CM | POA: Insufficient documentation

## 2015-04-15 DIAGNOSIS — Y9389 Activity, other specified: Secondary | ICD-10-CM | POA: Insufficient documentation
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
## 2015-04-16 MED ORDER — IBUPROFEN 800 MG PO TABS: 800.0000 mg | ORAL_TABLET | Freq: Three times a day (TID) | ORAL | Status: DC

## 2015-04-16 NOTE — Discharge Instructions (Signed)
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
## 2015-10-04 ENCOUNTER — Emergency Department (HOSPITAL_COMMUNITY)
Admission: EM | Admit: 2015-10-04 | Discharge: 2015-10-04 | Disposition: A | Discharge status: Home / Self Care | Payer: Medicaid Other | Attending: Emergency Medicine | Admitting: Emergency Medicine

## 2015-10-04 ENCOUNTER — Encounter (HOSPITAL_COMMUNITY): Payer: Self-pay | Admitting: *Deleted

## 2015-10-04 DIAGNOSIS — N12 Tubulo-interstitial nephritis, not specified as acute or chronic: Secondary | ICD-10-CM | POA: Diagnosis not present

## 2015-10-04 DIAGNOSIS — R52 Pain, unspecified: Secondary | ICD-10-CM

## 2015-10-04 DIAGNOSIS — Z7982 Long term (current) use of aspirin: Secondary | ICD-10-CM | POA: Insufficient documentation

## 2015-10-04 DIAGNOSIS — F17210 Nicotine dependence, cigarettes, uncomplicated: Secondary | ICD-10-CM | POA: Diagnosis not present

## 2015-10-04 LAB — URINALYSIS, ROUTINE W REFLEX MICROSCOPIC
Bilirubin Urine: NEGATIVE
Glucose, UA: NEGATIVE mg/dL
Hgb urine dipstick: NEGATIVE
Ketones, ur: NEGATIVE mg/dL
Nitrite: POSITIVE — AB
PH: 6 (ref 5.0–8.0)
Protein, ur: 30 mg/dL — AB
SPECIFIC GRAVITY, URINE: 1.029 (ref 1.005–1.030)

## 2015-10-04 LAB — URINE MICROSCOPIC-ADD ON

## 2015-10-04 LAB — RAPID STREP SCREEN (MED CTR MEBANE ONLY): Streptococcus, Group A Screen (Direct): NEGATIVE

## 2015-10-04 LAB — POC URINE PREG, ED: Preg Test, Ur: NEGATIVE

## 2015-10-04 MED ORDER — ONDANSETRON 4 MG PO TBDP
4.0000 mg | ORAL_TABLET | Freq: Three times a day (TID) | ORAL | 0 refills | Status: DC | PRN
Start: 2015-10-04 — End: 2017-07-19

## 2015-10-04 MED ORDER — LIDOCAINE VISCOUS 2 % MT SOLN
15.0000 mL | Freq: Once | OROMUCOSAL | Status: AC
  Administered 2015-10-04: 15 mL via OROMUCOSAL
  Filled 2015-10-04: qty 15

## 2015-10-04 MED ORDER — ONDANSETRON 4 MG PO TBDP
4.0000 mg | ORAL_TABLET | Freq: Once | ORAL | Status: AC
  Administered 2015-10-04: 4 mg via ORAL
  Filled 2015-10-04: qty 1

## 2015-10-04 MED ORDER — CIPROFLOXACIN HCL 500 MG PO TABS: 500.0000 mg | ORAL_TABLET | Freq: Two times a day (BID) | ORAL | 0 refills | Status: AC

## 2015-10-04 MED ORDER — KETOROLAC TROMETHAMINE 60 MG/2ML IM SOLN
60.0000 mg | Freq: Once | INTRAMUSCULAR | Status: AC
  Administered 2015-10-04: 60 mg via INTRAMUSCULAR
  Filled 2015-10-04: qty 2

## 2015-10-04 NOTE — ED Provider Notes (Signed)
[facility]-EMERGENCY DEPT Provider Note   CSN: [id number] Arrival date & time: 10/04/15  4259  First Provider Contact:  None       History   Chief Complaint Chief Complaint  Patient presents with  . Generalized Body Aches    HPI Misty Ewing is a 25 y.o. female.  HPI   Yesterday morning began having hot/cold sensation, body aches, sore throat Not eating, however drinking ok Severe body aches Nothing helps, tried sitting in hot water which didn't help, massage helped a little Not out in heat, no known tick bites-stay in house No sick contacts Entire sore throat hurts Nausea, fatigue  Past Medical History:  Diagnosis Date  . Depression     There are no active problems to display for this patient.   Past Surgical History:  Procedure Laterality Date  . NO PAST SURGERIES      OB History    Gravida Para Term Preterm AB Living   0 4   SAB TAB Ectopic Multiple Live Births   0 0 0 0         Home Medications    Prior to Admission medications   Medication Sig Start Date End Date Taking? Authorizing Provider  aspirin-acetaminophen-caffeine (EXCEDRIN MIGRAINE) [id number] MG tablet Take 2 tablets by mouth every 6 (six) hours as needed for headache.   Yes Historical Provider, MD  ciprofloxacin (CIPRO) 500 MG tablet Take 1 tablet (500 mg total) by mouth every 12 (twelve) hours. 10/04/15 10/18/15  Alvira Monday, MD  ondansetron (ZOFRAN ODT) 4 MG disintegrating tablet Take 1 tablet (4 mg total) by mouth every 8 (eight) hours as needed for nausea or vomiting. 10/04/15   Alvira Monday, MD    Family History Family History  Problem Relation Age of Onset  . Anesthesia problems Neg Hx   . Hypotension Neg Hx   . Malignant hyperthermia Neg Hx   . Pseudochol deficiency Neg Hx     Social History Social History  Substance Use Topics  . Smoking status: Current Every Day Smoker    Packs/day: 1.00    Types: Cigarettes  . Smokeless tobacco: Never Used  .  Alcohol use Yes     Allergies   Bee venom and Penicillins   Review of Systems Review of Systems  Constitutional: Positive for appetite change, chills and fever.  HENT: Positive for sore throat. Negative for congestion.   Eyes: Negative for visual disturbance.  Respiratory: Negative for cough and shortness of breath.   Cardiovascular: Negative for chest pain.  Gastrointestinal: Positive for abdominal pain and nausea. Negative for constipation, diarrhea and vomiting.  Genitourinary: Negative for dysuria, vaginal bleeding and vaginal discharge.  Musculoskeletal: Positive for myalgias.  Skin: Negative for rash.  Neurological: Positive for headaches. Negative for syncope.     Physical Exam Updated Vital Signs BP 102/69 (BP Location: Left Arm)   Pulse 85   Temp 97.8 F (36.6 C) (Oral)   Resp 16   SpO2 96%   Physical Exam  Constitutional: She is oriented to person, place, and time. She appears well-developed and well-nourished. No distress.  HENT:  Head: Normocephalic and atraumatic.  Eyes: Conjunctivae and EOM are normal.  Neck: Normal range of motion.  Cardiovascular: Normal rate, regular rhythm, normal heart sounds and intact distal pulses.  Exam reveals no gallop and no friction rub.   No murmur heard. Pulmonary/Chest: Effort normal and breath sounds normal. No respiratory distress. She has no wheezes. She has no rales.  Abdominal: Soft. She exhibits no distension. There is no tenderness. There is CVA tenderness (left). There is no guarding.  Musculoskeletal: She exhibits no edema or tenderness.  Neurological: She is alert and oriented to person, place, and time.  Skin: Skin is warm and dry. No rash noted. She is not diaphoretic. No erythema.  Nursing note and vitals reviewed.    ED Treatments / Results  Labs (all labs ordered are listed, but only abnormal results are displayed) Labs Reviewed  URINALYSIS, ROUTINE W REFLEX MICROSCOPIC (NOT AT [facility]) - Abnormal; Notable  for the following:       Result Value   Color, Urine AMBER (*)    APPearance CLOUDY (*)    Protein, ur 30 (*)    Nitrite POSITIVE (*)    Leukocytes, UA MODERATE (*)    All other components within normal limits  URINE MICROSCOPIC-ADD ON - Abnormal; Notable for the following:    Squamous Epithelial / LPF 0-5 (*)    Bacteria, UA MANY (*)    All other components within normal limits  RAPID STREP SCREEN (NOT AT [facility])  CULTURE, GROUP A STREP ([facility])  POC URINE PREG, ED    EKG  EKG Interpretation None       Radiology No results found.  Procedures Procedures (including critical care time)  Medications Ordered in ED Medications  ondansetron (ZOFRAN-ODT) disintegrating tablet 4 mg (4 mg Oral Given 10/04/15 1010)  ketorolac (TORADOL) injection 60 mg (60 mg Intramuscular Given 10/04/15 1009)  lidocaine (XYLOCAINE) 2 % viscous mouth solution 15 mL (15 mLs Mouth/Throat Given 10/04/15 1013)     Initial Impression / Assessment and Plan / ED Course  I have reviewed the triage vital signs and the nursing notes.  Pertinent labs & imaging results that were available during my care of the patient were reviewed by me and considered in my medical decision making (see chart for details).  Clinical Course    25 year old female with no significant medical history presents with concern of body aches, sore throat, subjective fevers and chills. Patient with no other medical problems, normal by mouth intake prior to yesterday, and have low suspicion for significant electrolyte abnormalities as etiology of the symptoms. Abd exam benign. No meningeal signs.  Strep screen was done and was negative. Given some flank tenderness on exam, urinalysis was performed and showed signs of UTI.  Patient given Toradol for myalgias, lidocaine for sore throat.  GIven rx for cipro for 14 days, zofran, and recommend ibuprofen. Patient discharged in stable condition with understanding of reasons to return.   Final  Clinical Impressions(s) / ED Diagnoses   Final diagnoses:  Body aches  Pyelonephritis    New Prescriptions Discharge Medication List as of 10/04/2015 12:09 PM    START taking these medications   Details  ciprofloxacin (CIPRO) 500 MG tablet Take 1 tablet (500 mg total) by mouth every 12 (twelve) hours., Starting Thu 10/04/2015, Until Thu 10/18/2015, Print    ondansetron (ZOFRAN ODT) 4 MG disintegrating tablet Take 1 tablet (4 mg total) by mouth every 8 (eight) hours as needed for nausea or vomiting., Starting Thu 10/04/2015, Print         Alvira Monday, MD 10/04/15 2354
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
## 2015-10-07 LAB — CULTURE, GROUP A STREP (THRC)

## 2016-06-02 ENCOUNTER — Encounter (HOSPITAL_COMMUNITY): Payer: Self-pay

## 2016-06-02 ENCOUNTER — Emergency Department (HOSPITAL_COMMUNITY)
Admission: EM | Admit: 2016-06-02 | Discharge: 2016-06-02 | Disposition: A | Discharge status: Home / Self Care | Payer: Medicaid Other | Attending: Emergency Medicine | Admitting: Emergency Medicine

## 2016-06-02 DIAGNOSIS — K0889 Other specified disorders of teeth and supporting structures: Secondary | ICD-10-CM | POA: Insufficient documentation

## 2016-06-02 DIAGNOSIS — F17210 Nicotine dependence, cigarettes, uncomplicated: Secondary | ICD-10-CM | POA: Insufficient documentation

## 2016-06-02 DIAGNOSIS — Z7982 Long term (current) use of aspirin: Secondary | ICD-10-CM | POA: Insufficient documentation

## 2016-06-02 MED ORDER — NAPROXEN 500 MG PO TABS
500.0000 mg | ORAL_TABLET | Freq: Two times a day (BID) | ORAL | 0 refills | Status: DC
Start: 2016-06-02 — End: 2016-11-25

## 2016-06-02 MED ORDER — CLINDAMYCIN HCL 150 MG PO CAPS: 300.0000 mg | ORAL_CAPSULE | Freq: Three times a day (TID) | ORAL | 0 refills | Status: DC

## 2016-06-02 MED ORDER — TRAMADOL HCL 50 MG PO TABS: 50.0000 mg | ORAL_TABLET | Freq: Four times a day (QID) | ORAL | 0 refills | Status: DC | PRN

## 2016-06-02 NOTE — ED Triage Notes (Signed)
Patient complains of 3 days of right sided facial pain and unable to chew due to the pain, thinks she has broken tooth, NAD

## 2016-06-02 NOTE — Discharge Instructions (Signed)
Naprosyn for pain and inflammation. Tramadol for severe pain. Clindamycin for possible early infection. Follow up with a dentist.

## 2016-06-02 NOTE — ED Provider Notes (Signed)
[facility]-EMERGENCY DEPT Provider Note   CSN: [id number] Arrival date & time: 06/02/16  1019  By signing my name below, I, Marnette Burgess Long, attest that this documentation has been prepared under the direction and in the presence of Linard Daft, PA-C. Electronically Signed: Marnette Burgess Long, Scribe. 06/02/2016. 12:05 PM.   History   Chief Complaint No chief complaint on file.  The history is provided by the patient and medical records. No language interpreter was used.    HPI Comments:  Misty Ewing is a 26 y.o. female with a PMHx of Depression, who presents to the Emergency Department complaining of constant, severe, right sided facial pain onset three days ago. Per pt, she has not eaten in three days d/t the dental pain. She is unsure if there is any specific dental problem as it hurts too bad to open her mouth. She notes an additional hole in her left second molar from a previous dental cavity with associated right jaw pain. No prior h/o dental abscess. Opening her mouth and chewing exacerbates her pain. She did not try anything for relief of her pain at home as she states "Tylenol and Ibuprofen do not do anything". Pt denies any other complaints at this time. Pt is a current every day smoker.   Past Medical History:  Diagnosis Date  . Depression    There are no active problems to display for this patient.  Past Surgical History:  Procedure Laterality Date  . NO PAST SURGERIES      OB History    Gravida Para Term Preterm AB Living   4 4 3 1  0 4   SAB TAB Ectopic Multiple Live Births   0 0 0 0 1     Home Medications    Prior to Admission medications   Medication Sig Start Date End Date Taking? Authorizing Provider  aspirin-acetaminophen-caffeine (EXCEDRIN MIGRAINE) [id number] MG tablet Take 2 tablets by mouth every 6 (six) hours as needed for headache.    Historical Provider, MD  ondansetron (ZOFRAN ODT) 4 MG disintegrating tablet Take 1 tablet (4 mg total) by  mouth every 8 (eight) hours as needed for nausea or vomiting. 10/04/15   Alvira Monday, MD    Family History Family History  Problem Relation Age of Onset  . Anesthesia problems Neg Hx   . Hypotension Neg Hx   . Malignant hyperthermia Neg Hx   . Pseudochol deficiency Neg Hx     Social History Social History  Substance Use Topics  . Smoking status: Current Every Day Smoker    Packs/day: 1.00    Types: Cigarettes  . Smokeless tobacco: Never Used  . Alcohol use Yes     Allergies   Bee venom and Penicillins   Review of Systems Review of Systems  Constitutional: Negative for chills and fever.  HENT: Positive for dental problem and ear pain. Negative for facial swelling, mouth sores, sinus pain and sore throat.   Musculoskeletal: Positive for arthralgias.  Neurological: Positive for headaches.     Physical Exam Updated Vital Signs BP 107/72 (BP Location: Left Arm)   Pulse 79   Temp 97.7 F (36.5 C) (Oral)   Resp 18   SpO2 100%   Physical Exam  Constitutional: She is oriented to person, place, and time. She appears well-developed and well-nourished.  HENT:  Head: Normocephalic.  No obvious facial swelling. Tenderness to palpation over the right TMJ and right mandible. No trismus but there is pain with opening the mouth. There  is a right lower second molar tenderness with no obvious infectious process or any obvious cavities. No swelling under the tongue. No submandibular or preauricular lymphadenopathy.  Eyes: Conjunctivae are normal.  Neck: Normal range of motion. Neck supple.  Cardiovascular: Normal rate.   Pulmonary/Chest: Effort normal.  Abdominal: She exhibits no distension.  Musculoskeletal: Normal range of motion.  Neurological: She is alert and oriented to person, place, and time.  Skin: Skin is warm and dry.  Psychiatric: She has a normal mood and affect.  Nursing note and vitals reviewed.    ED Treatments / Results  DIAGNOSTIC STUDIES:  Oxygen  Saturation is 100% on RA, normal by my interpretation.    COORDINATION OF CARE:  12:04 PM Discussed treatment plan with pt at bedside including Abx and pain medication and pt agreed to plan.  Labs (all labs ordered are listed, but only abnormal results are displayed) Labs Reviewed - No data to display  EKG  EKG Interpretation None       Radiology No results found.  Procedures Procedures (including critical care time)  Medications Ordered in ED Medications - No data to display   Initial Impression / Assessment and Plan / ED Course  I have reviewed the triage vital signs and the nursing notes.  Pertinent labs & imaging results that were available during my care of the patient were reviewed by me and considered in my medical decision making (see chart for details).   patient history right mandibular and right TMJ pain. She does have some tenderness over the right lower second molar, however do not see any obvious signs of infection or large cavities. She does also have tenderness in her right TMJ. I suspect this is dental issue versus TMJ. Will start on NSAIDs. Will cover antibiotic for possible infection. Will follow-up with a dentist for dental screening since she has not had one in a while to rule out a dental process.   Vitals:   06/02/16 1024 06/02/16 1212  BP: 107/72 (!) 93/50  Pulse: 79 75  Resp: 18 16  Temp: 97.7 F (36.5 C)   TempSrc: Oral   SpO2: 100% 100%    Final Clinical Impressions(s) / ED Diagnoses   Final diagnoses:  Pain, dental    New Prescriptions Discharge Medication List as of 06/02/2016 12:12 PM    START taking these medications   Details  clindamycin (CLEOCIN) 150 MG capsule Take 2 capsules (300 mg total) by mouth 3 (three) times daily., Starting Mon 06/02/2016, Print    naproxen (NAPROSYN) 500 MG tablet Take 1 tablet (500 mg total) by mouth 2 (two) times daily., Starting Mon 06/02/2016, Print    traMADol (ULTRAM) 50 MG tablet Take 1 tablet  (50 mg total) by mouth every 6 (six) hours as needed., Starting Mon 06/02/2016, Print       I personally performed the services described in this documentation, which was scribed in my presence. The recorded information has been reviewed and is accurate.     Jaynie Crumbleatyana Col[place], PA-C 06/02/16 1653    Shaune Pollackameron Isaacs, MD 06/02/16 2137

## 2016-06-21 ENCOUNTER — Encounter (HOSPITAL_COMMUNITY): Payer: Self-pay | Admitting: Nurse Practitioner

## 2016-06-21 ENCOUNTER — Emergency Department (HOSPITAL_COMMUNITY)
Admission: EM | Admit: 2016-06-21 | Discharge: 2016-06-21 | Disposition: A | Discharge status: Home / Self Care | Payer: Medicaid Other | Attending: Emergency Medicine | Admitting: Emergency Medicine

## 2016-06-21 DIAGNOSIS — F17210 Nicotine dependence, cigarettes, uncomplicated: Secondary | ICD-10-CM | POA: Insufficient documentation

## 2016-06-21 DIAGNOSIS — M26629 Arthralgia of temporomandibular joint, unspecified side: Secondary | ICD-10-CM

## 2016-06-21 DIAGNOSIS — Z7982 Long term (current) use of aspirin: Secondary | ICD-10-CM | POA: Insufficient documentation

## 2016-06-21 DIAGNOSIS — M2669 Other specified disorders of temporomandibular joint: Secondary | ICD-10-CM | POA: Insufficient documentation

## 2016-06-21 DIAGNOSIS — Z79899 Other long term (current) drug therapy: Secondary | ICD-10-CM | POA: Insufficient documentation

## 2016-06-21 NOTE — ED Provider Notes (Signed)
[facility]EM[facility]Y DEPT Provider Note   CSN: [id number] Arrival date & time: 06/21/16  1517  By signing my name below, I, Nelwyn Salisbury, attest that this documentation has been prepared under the direction and in the presence of non-physician practitioner, Candie Mile, PA-C. Electronically Signed: Nelwyn Salisbury, Scribe. 06/21/2016. 4:06 PM.  History   Chief Complaint Chief Complaint  Patient presents with  . Jaw Pain   The history is provided by the patient. No language interpreter was used.    HPI Comments:  Misty Ewing is a 26 y.o. female with no pertinent pmhx who presents to the Emergency Department complaining of constant, worsening, right-sided jaw pain onset 3 weeks. She describes her pain as a 10/10 sharp pain that radiates up into her right ear. Her pain is exacerbated when opening her mouth and as such her p/o intake is decreased. She reports associated neck pain. Pt was seen 2 weeks ago for similar sx. She was dx with a dental infection and given abx. She has been compliant with these which took away her dental problem but did not relieve her pain. Pt has also tried naproxen, ibuprofen, tylenol and tramadol with no relief. Denies any trouble swallowing, shortness of breath, hearing loss, dizziness, fever, chills, nausea, vomiting, gait problem, trauma, dentalgia, or pain when closing her mouth/biting down.    Past Medical History:  Diagnosis Date  . Depression     There are no active problems to display for this patient.   Past Surgical History:  Procedure Laterality Date  . NO PAST SURGERIES      OB History    Gravida Para Term Preterm AB Living   0 4   SAB TAB Ectopic Multiple Live Births   0 0 0 0 1       Home Medications    Prior to Admission medications   Medication Sig Start Date End Date Taking? Authorizing Provider  aspirin-acetaminophen-caffeine (EXCEDRIN MIGRAINE) [phone] MG tablet Take 2 tablets by mouth every 6 (six) hours as  needed for headache.    Historical Provider, MD  clindamycin (CLEOCIN) 150 MG capsule Take 2 capsules (300 mg total) by mouth 3 (three) times daily. 06/02/16   Tatyana Kirichenko, PA-C  naproxen (NAPROSYN) 500 MG tablet Take 1 tablet (500 mg total) by mouth 2 (two) times daily. 06/02/16   Tatyana Kirichenko, PA-C  ondansetron (ZOFRAN ODT) 4 MG disintegrating tablet Take 1 tablet (4 mg total) by mouth every 8 (eight) hours as needed for nausea or vomiting. 10/04/15   Alvira Monday, MD  traMADol (ULTRAM) 50 MG tablet Take 1 tablet (50 mg total) by mouth every 6 (six) hours as needed. 06/02/16   Jaynie Crumble, PA-C    Family History Family History  Problem Relation Age of Onset  . Anesthesia problems Neg Hx   . Hypotension Neg Hx   . Malignant hyperthermia Neg Hx   . Pseudochol deficiency Neg Hx     Social History Social History  Substance Use Topics  . Smoking status: Current Every Day Smoker    Packs/day: 1.00    Types: Cigarettes  . Smokeless tobacco: Never Used  . Alcohol use No     Allergies   Bee venom and Penicillins   Review of Systems Review of Systems  Constitutional: Negative for chills and fever.  HENT: Positive for ear pain. Negative for hearing loss.        Positive for Jaw Pain  Gastrointestinal: Negative for nausea and vomiting.  Musculoskeletal: Positive for neck pain. Negative for gait problem.  Neurological: Negative for dizziness.     Physical Exam Updated Vital Signs BP 100/65   Pulse (!) 103   Temp 98.8 F (37.1 C) (Oral)   Resp 18   SpO2 96%   Physical Exam  Constitutional: She appears well-developed and well-nourished.  Well appearing. Airway patent. No drooling, no stridor, no trismus. No respiratory distress.  HENT:  Head: Normocephalic and atraumatic.  Right Ear: External ear normal.  Left Ear: External ear normal.  Nose: Nose normal.  Mouth/Throat: Oropharynx is clear and moist.  Tenderness to right TMJ. Oropharynx clear moist.  Dentition normal with no evidence of cavities, or avulsions, breaks. No tenderness to palpation on dentition. Gum is not red, tender, or swelling. No evidence of dental abscess. No evidence of tonsillar swelling, redness, exudates. Ears without evidence of infection redness bulging. TMs are clear bilaterally. No redness, swelling, tenderness to mastoid process bilaterally.  Eyes: Conjunctivae and EOM are normal.  Neck: Normal range of motion. No tracheal deviation present.  Mild tenderness to palpation to neck. No swelling noted.  Cardiovascular: Normal rate.   Pulmonary/Chest: Effort normal. No stridor. No respiratory distress.  Normal work of breathing. No respiratory distress noted.   Abdominal: Soft.  Musculoskeletal: Normal range of motion.  Neurological: She is alert.  Skin: Skin is warm.  Psychiatric: She has a normal mood and affect. Her behavior is normal.  Nursing note and vitals reviewed.    ED Treatments / Results  DIAGNOSTIC STUDIES:  Oxygen Saturation is 96% on RA, normal by my interpretation.    COORDINATION OF CARE:  4:23 PM Discussed treatment plan with pt at bedside which includes follow-up with ENT specialist, OTC pain medications and pt agreed to plan.  Labs (all labs ordered are listed, but only abnormal results are displayed) Labs Reviewed - No data to display  EKG  EKG Interpretation None       Radiology No results found.  Procedures Procedures (including critical care time)  Medications Ordered in ED Medications - No data to display   Initial Impression / Assessment and Plan / ED Course  I have reviewed the triage vital signs and the nursing notes.  Pertinent labs & imaging results that were available during my care of the patient were reviewed by me and considered in my medical decision making (see chart for details).    Patient recently seen here dental infection versus TMJ. She appears today again with saline worsening pain to right TMJ.  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  No abscess requiring immediate incision and drainage.  Exam not concerning for Ludwig's angina or pharyngeal abscess. TM's are clear and do not show evidence of ear infection. Patient is in no apparent distress, afebrile, hemodynamic stable. Airways patent, no stridor, no drooling, no trismus, no respiratory distress. Patient able to speak in full sentences. Discussed return precautions. Pt safe for discharge.   Final Clinical Impressions(s) / ED Diagnoses   Final diagnoses:  TMJ syndrome    New Prescriptions Discharge Medication List as of 06/21/2016  4:33 PM    I personally performed the services described in this documentation, which was scribed in my presence. The recorded information has been reviewed and is accurate.    [address] [place], [place] 06/21/16 [address] [place], [place] 06/21/16 1643    Nira Conn, MD  06/21/16 1808  

## 2016-06-21 NOTE — Discharge Instructions (Signed)
Please follow up with ENT regarding today's visit. Please use warm compress to your right jaw. Please continue ibuprofen or Tylenol as needed for pain. Please also follow up with her primary care proder regarding today's visit.  Contact a health care provider if: You are having trouble eating. You have new or worsening symptoms. Get help right away if: Your jaw locks open or closed.

## 2016-06-21 NOTE — ED Triage Notes (Signed)
Pt presents with c/o R jaw pain. The pain began about 3 days ago. The pain is radiating into her R ear. The pain has been getting worse since onset and she has been unable to eat. She was here about 2 weeks ago for dental infection and treated with antibiotics which she completed

## 2016-11-25 ENCOUNTER — Encounter (HOSPITAL_COMMUNITY): Payer: Self-pay | Admitting: *Deleted

## 2016-11-25 ENCOUNTER — Emergency Department (HOSPITAL_COMMUNITY)
Admission: EM | Admit: 2016-11-25 | Discharge: 2016-11-25 | Disposition: A | Discharge status: Home / Self Care | Payer: Self-pay | Attending: Emergency Medicine | Admitting: Emergency Medicine

## 2016-11-25 DIAGNOSIS — F17210 Nicotine dependence, cigarettes, uncomplicated: Secondary | ICD-10-CM | POA: Insufficient documentation

## 2016-11-25 DIAGNOSIS — H0014 Chalazion left upper eyelid: Secondary | ICD-10-CM | POA: Insufficient documentation

## 2016-11-25 DIAGNOSIS — H0011 Chalazion right upper eyelid: Secondary | ICD-10-CM | POA: Insufficient documentation

## 2016-11-25 DIAGNOSIS — H0013 Chalazion right eye, unspecified eyelid: Secondary | ICD-10-CM

## 2016-11-25 DIAGNOSIS — F329 Major depressive disorder, single episode, unspecified: Secondary | ICD-10-CM | POA: Insufficient documentation

## 2016-11-25 DIAGNOSIS — Z79899 Other long term (current) drug therapy: Secondary | ICD-10-CM | POA: Insufficient documentation

## 2016-11-25 MED ORDER — ERYTHROMYCIN 5 MG/GM OP OINT
1.0000 "application " | TOPICAL_OINTMENT | Freq: Once | OPHTHALMIC | Status: AC
  Administered 2016-11-25: 1 via OPHTHALMIC
  Filled 2016-11-25: qty 3.5

## 2016-11-25 MED ORDER — ERYTHROMYCIN 5 MG/GM OP OINT: TOPICAL_OINTMENT | OPHTHALMIC | 0 refills | Status: AC

## 2016-11-25 MED ORDER — NAPROXEN 500 MG PO TABS: 500.0000 mg | ORAL_TABLET | Freq: Two times a day (BID) | ORAL | 0 refills | Status: DC

## 2016-11-25 NOTE — ED Triage Notes (Signed)
Pt is here with sty starting on right eye and states everytime she starts seeing black dots

## 2016-11-25 NOTE — Discharge Instructions (Signed)
As discussed, apply erythromycin 2 times daily for the next 7 days and continue with warm compresses 4 times daily.  Follow up with Dr Sharl Ma (ophthalmologist) eye specialist for recurrent chalazion. Return if you experience worsening swelling around the eye, redness, pain with eye movement, visual disturbances or other concerning symptoms in the meantime.

## 2016-11-26 NOTE — ED Provider Notes (Signed)
[facility] Provider Note   CSN: [id number] Arrival date & time: 11/25/16  1543     History   Chief Complaint Chief Complaint  Patient presents with  . Eye Problem    HPI Misty Ewing is a 26 y.o. female presenting with recurrent stye to the right upper lid which appeared yesterday and started being painful while at work this morning. She reports the sensation as a throbbing pain. No alleviating factors. She reports sensitivity to heat and noted black spots appearing in her field of view when she goes out in the sun which occur only when she has one of those styes. No visual disturbance at this time. No pain with eye movement. She has tried warm compress and mild shampoo cleansing without relief. She has a chronic stye to the left upper eyelid which is nonpainful and reports that it has recurred 6 times in that same area. She denies contact lens wearing, pain in the eye, surrounding swelling or pain around the eye.   HPI  Past Medical History:  Diagnosis Date  . Depression     There are no active problems to display for this patient.   Past Surgical History:  Procedure Laterality Date  . NO PAST SURGERIES      OB History    Gravida Para Term Preterm AB Living   0 4   SAB TAB Ectopic Multiple Live Births   0 0 0 0 1       Home Medications    Prior to Admission medications   Medication Sig Start Date End Date Taking? Authorizing Provider  aspirin-acetaminophen-caffeine (EXCEDRIN MIGRAINE) [phone] MG tablet Take 2 tablets by mouth every 6 (six) hours as needed for headache.    [provider]  clindamycin (CLEOCIN) 150 MG capsule Take 2 capsules (300 mg total) by mouth 3 (three) times daily. 06/02/16   Kirichenko, Lemont Fillers, PA-C  erythromycin ophthalmic ointment Place a 1/2 inch ribbon of ointment into the lower eyelid. 11/25/16 12/02/16  Mathews Robinsons B, PA-C  naproxen (NAPROSYN) 500 MG tablet Take 1 tablet (500 mg total) by mouth 2  (two) times daily. 11/25/16   Mathews Robinsons B, PA-C  ondansetron (ZOFRAN ODT) 4 MG disintegrating tablet Take 1 tablet (4 mg total) by mouth every 8 (eight) hours as needed for nausea or vomiting. 10/04/15   Alvira Monday, MD  traMADol (ULTRAM) 50 MG tablet Take 1 tablet (50 mg total) by mouth every 6 (six) hours as needed. 06/02/16   Jaynie Crumble, PA-C    Family History Family History  Problem Relation Age of Onset  . Anesthesia problems Neg Hx   . Hypotension Neg Hx   . Malignant hyperthermia Neg Hx   . Pseudochol deficiency Neg Hx     Social History Social History  Substance Use Topics  . Smoking status: Current Every Day Smoker    Packs/day: 1.00    Types: Cigarettes  . Smokeless tobacco: Never Used  . Alcohol use No     Allergies   Bee venom and Penicillins   Review of Systems Review of Systems  Constitutional: Negative for chills and fever.  HENT: Negative for congestion and facial swelling.   Eyes: Positive for visual disturbance. Negative for pain, discharge, redness and itching.       Black spots when going out in the sun. Resolved  Respiratory: Negative for cough, shortness of breath, wheezing and stridor.   Cardiovascular: Negative for chest pain.  Musculoskeletal: Negative for  myalgias, neck pain and neck stiffness.  Skin: Positive for color change and wound. Negative for pallor and rash.  Neurological: Negative for dizziness and headaches.     Physical Exam Updated Vital Signs BP 99/62 (BP Location: Left Arm)   Pulse 87   Temp 98.6 F (37 C) (Oral)   Resp 18   SpO2 99%   Physical Exam  Constitutional: She appears well-developed and well-nourished. No distress.  Afebrile, nontoxic-appearing, sitting in chair in no acute distress.  HENT:  Head: Normocephalic and atraumatic.  Mouth/Throat: Oropharynx is clear and moist. No oropharyngeal exudate.  Eyes: Pupils are equal, round, and reactive to light. Conjunctivae and EOM are normal. Right  eye exhibits no discharge. Left eye exhibits no discharge.  No periorbital edema, warmth or erythema. No pain with EOM. No discharge. Chalazion to upper lids bilaterally with right being symptomatic acutely. lacrimals without lesions. Conjunctiva and sclera are white and non-injected. Anterior chamber is deep and normal appearing. Irises are round and reactive. Lenses are clear.    Visual Acuity  Right Eye Distance: 20/20 without corrective lens Left Eye Distance: 20/20 without corrective lens Bilateral Distance: 20/20 without corrective lens  Neck: Normal range of motion. Neck supple.  Cardiovascular: Normal rate, regular rhythm and normal heart sounds.   No murmur heard. Pulmonary/Chest: Effort normal and breath sounds normal. No respiratory distress. She has no wheezes. She has no rales.  Musculoskeletal: Normal range of motion. She exhibits no edema.  Neurological: She is alert.  Skin: Skin is warm and dry. No rash noted. She is not diaphoretic. No erythema. No pallor.  Psychiatric: She has a normal mood and affect.  Nursing note and vitals reviewed.    ED Treatments / Results  Labs (all labs ordered are listed, but only abnormal results are displayed) Labs Reviewed - No data to display  EKG  EKG Interpretation None       Radiology No results found.  Procedures Procedures (including critical care time)  Medications Ordered in ED Medications  erythromycin ophthalmic ointment 1 application (1 application Right Eye Given 11/25/16 1852)     Initial Impression / Assessment and Plan / ED Course  I have reviewed the triage vital signs and the nursing notes.  Pertinent labs & imaging results that were available during my care of the patient were reviewed by me and considered in my medical decision making (see chart for details).    Patient presenting with recurrent chalazion. Reassuring exam otherwise. Will discharge patient with erythromycin and close follow-up with  ophthalmology given chronic recurrence.  She stated that she had never seen an eye specialist for this. No contact wear, vision intact. Patient otherwise nontoxic well-appearing and afebrile.  Discussed strict return precautions and advised to return to the emergency department if experiencing any new or worsening symptoms. Instructions were understood and patient agreed with discharge plan. Final Clinical Impressions(s) / ED Diagnoses   Final diagnoses:  Acute chalazion of right eye    New Prescriptions Discharge Medication List as of 11/25/2016  6:39 PM    START taking these medications   Details  erythromycin ophthalmic ointment Place a 1/2 inch ribbon of ointment into the lower eyelid., Print         Gregary Cromer 11/26/16 4098    Marily Memos, MD 11/27/16 [phone]

## 2017-02-08 ENCOUNTER — Emergency Department (HOSPITAL_COMMUNITY): Payer: Medicaid Other

## 2017-02-08 ENCOUNTER — Encounter (HOSPITAL_COMMUNITY): Payer: Self-pay

## 2017-02-08 ENCOUNTER — Emergency Department (HOSPITAL_COMMUNITY)
Admission: EM | Admit: 2017-02-08 | Discharge: 2017-02-08 | Disposition: A | Discharge status: Home / Self Care | Payer: Medicaid Other | Attending: Emergency Medicine | Admitting: Emergency Medicine

## 2017-02-08 DIAGNOSIS — F17210 Nicotine dependence, cigarettes, uncomplicated: Secondary | ICD-10-CM | POA: Insufficient documentation

## 2017-02-08 DIAGNOSIS — Z79899 Other long term (current) drug therapy: Secondary | ICD-10-CM | POA: Insufficient documentation

## 2017-02-08 DIAGNOSIS — X500XXA Overexertion from strenuous movement or load, initial encounter: Secondary | ICD-10-CM | POA: Insufficient documentation

## 2017-02-08 DIAGNOSIS — Y929 Unspecified place or not applicable: Secondary | ICD-10-CM | POA: Insufficient documentation

## 2017-02-08 DIAGNOSIS — Y9389 Activity, other specified: Secondary | ICD-10-CM | POA: Insufficient documentation

## 2017-02-08 DIAGNOSIS — Y999 Unspecified external cause status: Secondary | ICD-10-CM | POA: Insufficient documentation

## 2017-02-08 DIAGNOSIS — S39012A Strain of muscle, fascia and tendon of lower back, initial encounter: Secondary | ICD-10-CM | POA: Insufficient documentation

## 2017-02-08 MED ORDER — KETOROLAC TROMETHAMINE 15 MG/ML IJ SOLN
30.0000 mg | Freq: Once | INTRAMUSCULAR | Status: AC
  Administered 2017-02-08: 30 mg via INTRAMUSCULAR
  Filled 2017-02-08: qty 2

## 2017-02-08 MED ORDER — METHOCARBAMOL 500 MG PO TABS: 500.0000 mg | ORAL_TABLET | Freq: Every evening | ORAL | 0 refills | Status: DC | PRN

## 2017-02-08 MED ORDER — IBUPROFEN 800 MG PO TABS
800.0000 mg | ORAL_TABLET | Freq: Three times a day (TID) | ORAL | 0 refills | Status: DC
Start: 2017-02-08 — End: 2018-11-27

## 2017-02-08 NOTE — Discharge Instructions (Signed)
As discussed, the medication prescribed can help with muscle spasm but cannot be taken while driving, operating machinery or working. Take at nighttime as needed.  Apply heat to the area and used to instructions for back exercises. Massaging may also be helpful as well as topical muscle creams.  Follow up with your primary care provider or the wellness center if symptoms persist beyond a week. Return sooner if symptoms worsen, numbness, difficulty walking, loss of bowel or bladder function, fever chills or other new concerning symptoms in the meantime.

## 2017-02-08 NOTE — ED Triage Notes (Signed)
Patient here with right side and back pain after lifting dresser this am, pain with ROM

## 2017-02-08 NOTE — ED Notes (Addendum)
Pt reports she was lifting a dresser this AM with her brother when she hurt her R lower back. Pt reports she has had a pulled muscle in the past and ibuprofen/tylenol/muscle relaxants do not work for her. Pt also states she has tried heat with no relief. Sensation and mobility of all 4 extremities intact. Pt ambulatory to bed. Shanda BumpsJessica, PA at the bedside. A&Ox4

## 2017-02-08 NOTE — ED Notes (Signed)
Pt verbalized understanding of d/c instructions. VSS. All belongings with pt. Ambulatory to lobby with steady gait. MNo signature pad, hallway pt.

## 2017-02-08 NOTE — ED Notes (Signed)
Patient transported to X-ray 

## 2017-02-08 NOTE — ED Provider Notes (Signed)
[facility]   CSN: [id number] Arrival date & time: 02/08/17  1627     History   Chief Complaint Chief Complaint  Patient presents with  . back pain/lifting injury    HPI Misty Ewing is a 26 y.o. female with no significant past medical history presenting with sudden onset right sided low back pain and lower rib pain worse with deep breaths after lifting a dresser earlier today.  No loss of bowel bladder function, no difficulty ambulating.  Patient has tried her mother's muscle relaxer with modest relief.   HPI  Past Medical History:  Diagnosis Date  . Depression     There are no active problems to display for this patient.   Past Surgical History:  Procedure Laterality Date  . NO PAST SURGERIES      OB History    Gravida Para Term Preterm AB Living   4 4 3 1  0 4   SAB TAB Ectopic Multiple Live Births   0 0 0 0 1       Home Medications    Prior to Admission medications   Medication Sig Start Date End Date Taking? Authorizing Provider  aspirin-acetaminophen-caffeine (EXCEDRIN MIGRAINE) [phone] MG tablet Take 2 tablets by mouth every 6 (six) hours as needed for headache.    [provider]  clindamycin (CLEOCIN) 150 MG capsule Take 2 capsules (300 mg total) by mouth 3 (three) times daily. 06/02/16   Kirichenko, Lemont Fillersatyana, PA-C  ibuprofen (ADVIL,MOTRIN) 800 MG tablet Take 1 tablet (800 mg total) by mouth 3 (three) times daily. 02/08/17   Georgiana ShoreMitchell, Jessica B, PA-C  methocarbamol (ROBAXIN) 500 MG tablet Take 1 tablet (500 mg total) by mouth at bedtime as needed for muscle spasms. 02/08/17   Mathews RobinsonsMitchell, Jessica B, PA-C  naproxen (NAPROSYN) 500 MG tablet Take 1 tablet (500 mg total) by mouth 2 (two) times daily. 11/25/16   Mathews RobinsonsMitchell, Jessica B, PA-C  ondansetron (ZOFRAN ODT) 4 MG disintegrating tablet Take 1 tablet (4 mg total) by mouth every 8 (eight) hours as needed for nausea or vomiting. 10/04/15    Alvira MondaySchlossman, Erin, MD  traMADol (ULTRAM) 50 MG tablet Take 1 tablet (50 mg total) by mouth every 6 (six) hours as needed. 06/02/16   Jaynie CrumbleKirichenko, Tatyana, PA-C    Family History Family History  Problem Relation Age of Onset  . Anesthesia problems Neg Hx   . Hypotension Neg Hx   . Malignant hyperthermia Neg Hx   . Pseudochol deficiency Neg Hx     Social History Social History   Tobacco Use  . Smoking status: Current Every Day Smoker    Packs/day: 1.00    Types: Cigarettes  . Smokeless tobacco: Never Used  Substance Use Topics  . Alcohol use: No  . Drug use: No     Allergies   Bee venom and Penicillins   Review of Systems Review of Systems  Constitutional: Negative for chills and fever.  Respiratory: Negative for shortness of breath.        Right lower posterior rib pain worse with deep breaths  Cardiovascular: Negative for chest pain.  Gastrointestinal: Negative for nausea and vomiting.  Musculoskeletal: Positive for back pain and myalgias. Negative for arthralgias, gait problem, joint swelling, neck pain and neck stiffness.  Skin: Negative for color change, pallor and rash.  Neurological: Negative for weakness and numbness.     Physical Exam Updated Vital Signs BP 108/73   Pulse 78   Temp 98.3  F (36.8 C) (Oral)   Resp 18   LMP 01/28/2017   SpO2 99%   Physical Exam  Constitutional: She appears well-developed and well-nourished. No distress.  Afebrile, nontoxic-appearing, sitting comfortably on the side of the bed in no acute distress.  HENT:  Head: Normocephalic and atraumatic.  Neck: Normal range of motion. Neck supple.  Cardiovascular: Normal rate, regular rhythm, normal heart sounds and intact distal pulses.  Pulmonary/Chest: Effort normal and breath sounds normal. No stridor. No respiratory distress. She has no wheezes. She has no rales.  Musculoskeletal: Normal range of motion. She exhibits tenderness. She exhibits no edema or deformity.  No midline  tenderness palpation of the spine, tender to palpation of the right lower back musculature and right lower ribs posteriorly  Neurological: She is alert. No sensory deficit. She exhibits normal muscle tone.  5/5 strength in lower extremities bilaterally, normal stance and gait. NVI  Skin: Skin is warm and dry. No rash noted. She is not diaphoretic. No erythema. No pallor.  Psychiatric: She has a normal mood and affect.  Nursing note and vitals reviewed.    ED Treatments / Results  Labs (all labs ordered are listed, but only abnormal results are displayed) Labs Reviewed - No data to display  EKG  EKG Interpretation None       Radiology No results found.  Procedures Procedures (including critical care time)  Medications Ordered in ED Medications  ketorolac (TORADOL) 15 MG/ML injection 30 mg (30 mg Intramuscular Given 02/08/17 1938)     Initial Impression / Assessment and Plan / ED Course  I have reviewed the triage vital signs and the nursing notes.  Pertinent labs & imaging results that were available during my care of the patient were reviewed by me and considered in my medical decision making (see chart for details).    Patient presents with lower back pain.  No gross neurological deficits and normal neuro exam.  Patient has no gait abnormality or concern for cauda equina.  No loss of bowel or bladder control, fever, night sweats, weight loss, h/o malignancy, or IVDU.  RICE protocol and pain medications indicated and discussed with patient.   Dc home with symptomatic relief and close PCP follow-up.  Discussed strict return precautions and advised to return to the emergency department if experiencing any new or worsening symptoms. Instructions were understood and patient agreed with discharge plan.   Patient care transferred at end of shift to E. Merceda ElksHammond PA-C pending radiology results. Anticipate dc home with previously stated plan assuming no fractures. If rib fracture,  dc with analgesia and incentive spirometer.  Final Clinical Impressions(s) / ED Diagnoses   Final diagnoses:  Back strain, initial encounter    ED Discharge Orders        Ordered    ibuprofen (ADVIL,MOTRIN) 800 MG tablet  3 times daily     02/08/17 2010    methocarbamol (ROBAXIN) 500 MG tablet  At bedtime PRN     02/08/17 2010       Gregary CromerMitchell, Jessica B, PA-C 02/08/17 2019    Arby BarrettePfeiffer, Marcy, MD 02/08/17 2257

## 2017-02-08 NOTE — ED Notes (Signed)
Pt. returned from XR. 

## 2017-02-12 ENCOUNTER — Ambulatory Visit (INDEPENDENT_AMBULATORY_CARE_PROVIDER_SITE_OTHER): Payer: Medicaid Other | Admitting: Student

## 2017-02-12 ENCOUNTER — Other Ambulatory Visit (HOSPITAL_COMMUNITY)
Admission: RE | Admit: 2017-02-12 | Discharge: 2017-02-12 | Disposition: A | Discharge status: Home / Self Care | Payer: Medicaid Other | Source: Ambulatory Visit | Attending: Student | Admitting: Student

## 2017-02-12 ENCOUNTER — Encounter: Payer: Self-pay | Admitting: Student

## 2017-02-12 VITALS — BP 108/66 | HR 95 | Ht 60.0 in | Wt 123.0 lb

## 2017-02-12 DIAGNOSIS — Z309 Encounter for contraceptive management, unspecified: Secondary | ICD-10-CM | POA: Diagnosis present

## 2017-02-12 DIAGNOSIS — Z0000 Encounter for general adult medical examination without abnormal findings: Secondary | ICD-10-CM | POA: Insufficient documentation

## 2017-02-12 DIAGNOSIS — Z3202 Encounter for pregnancy test, result negative: Secondary | ICD-10-CM | POA: Insufficient documentation

## 2017-02-12 DIAGNOSIS — Z30013 Encounter for initial prescription of injectable contraceptive: Secondary | ICD-10-CM | POA: Insufficient documentation

## 2017-02-12 LAB — POCT PREGNANCY, URINE: PREG TEST UR: NEGATIVE

## 2017-02-12 MED ORDER — MEDROXYPROGESTERONE ACETATE 150 MG/ML IM SUSP
150.0000 mg | Freq: Once | INTRAMUSCULAR | Status: AC
  Administered 2017-02-12: 150 mg via INTRAMUSCULAR

## 2017-02-12 NOTE — Patient Instructions (Signed)

## 2017-02-12 NOTE — Progress Notes (Signed)
Subjective:     Patient ID: Misty Ewing, female   DOB: 15-Dec-1990, 26 y.o.   MRN: [id number]  HPI Patient Misty Ewing is a 26 y.o. [phone] Non-pregnant female here for Depo-provera shot and Pap smear. Patient reports no history of abnormal pap in the past. Patient does not want any extra STI testing; states she has had it recently and it was negative.   Review of Systems  Constitutional: Negative.   HENT: Negative.   Respiratory: Negative.   Cardiovascular: Negative.   Genitourinary: Negative.   Neurological: Negative.        Objective:   Physical Exam  Constitutional: She is oriented to person, place, and time. She appears well-developed.  Neck: Normal range of motion.  Pulmonary/Chest: Effort normal.  Abdominal: Soft.  Genitourinary:  Genitourinary Comments: NEFG; no lesions on vaginal walls. Vagina has some white milky discharge with no odor; no CMT, suprapubic or adnexal tenderness.   Neurological: She is alert and oriented to person, place, and time.  Skin: Skin is warm and dry.  Psychiatric: She has a normal mood and affect.       Assessment:     1. Annual physical exam   2. Pregnancy examination or test, negative result   3. Initiation of Depo Provera        Plan:     1. Pap smear done today 2. Depo done today.  3. Return in 3 months for repeat Depo.      Luna KitchensKathryn Vence Lalor CNM

## 2017-02-13 LAB — CYTOLOGY - PAP: Diagnosis: NEGATIVE

## 2017-02-14 ENCOUNTER — Other Ambulatory Visit: Payer: Self-pay | Admitting: Student

## 2017-02-14 ENCOUNTER — Encounter: Payer: Self-pay | Admitting: Student

## 2017-02-14 DIAGNOSIS — A599 Trichomoniasis, unspecified: Secondary | ICD-10-CM | POA: Insufficient documentation

## 2017-02-14 MED ORDER — METRONIDAZOLE 500 MG PO TABS: 500.0000 mg | ORAL_TABLET | Freq: Two times a day (BID) | ORAL | 0 refills | Status: DC

## 2017-02-18 ENCOUNTER — Telehealth: Payer: Self-pay

## 2017-02-18 NOTE — Telephone Encounter (Signed)
Per Susanne BordersKathyrn Kooistra, CNM,please call patient; she has BV and trich. RX for BV (flagyl 500 BID for 7 days) will treat both trich and BV. It's at her pharmacy. Her partner should get tested and treated if necessary. Called patient to inform her that she tested positive for trich and BV. Informed patient that medication was sent to pharmacy. Advised patient to have partner treated. Patient verbalized understanding.

## 2017-04-30 ENCOUNTER — Ambulatory Visit (INDEPENDENT_AMBULATORY_CARE_PROVIDER_SITE_OTHER): Payer: Medicaid Other | Admitting: *Deleted

## 2017-04-30 ENCOUNTER — Ambulatory Visit: Payer: Medicaid Other

## 2017-04-30 DIAGNOSIS — Z3049 Encounter for surveillance of other contraceptives: Secondary | ICD-10-CM | POA: Diagnosis present

## 2017-04-30 DIAGNOSIS — Z3042 Encounter for surveillance of injectable contraceptive: Secondary | ICD-10-CM

## 2017-04-30 MED ORDER — MEDROXYPROGESTERONE ACETATE 150 MG/ML IM SUSP
150.0000 mg | Freq: Once | INTRAMUSCULAR | Status: AC
  Administered 2017-04-30: 150 mg via INTRAMUSCULAR

## 2017-05-04 ENCOUNTER — Other Ambulatory Visit: Payer: Self-pay | Admitting: *Deleted

## 2017-05-04 DIAGNOSIS — T63441A Toxic effect of venom of bees, accidental (unintentional), initial encounter: Secondary | ICD-10-CM

## 2017-05-04 MED ORDER — EPINEPHRINE 0.3 MG/0.3ML IJ SOAJ: 0.3000 mg | Freq: Once | INTRAMUSCULAR | 0 refills | Status: AC

## 2017-05-05 NOTE — Progress Notes (Signed)
I have reviewed the chart and agree with nursing staff's documentation of this patient's encounter.  Elsie LincolnKelly Keiko Myricks, MD 05/05/2017 2:03 PM

## 2017-06-01 ENCOUNTER — Telehealth: Payer: Self-pay

## 2017-06-01 NOTE — Telephone Encounter (Signed)
Pt called the office stating that she has been bleeding since she received the Depo shot on 04/30/17. Pt informed that she is likely to have irregular bleeding until she receives her 3rd Depo injection. Pt states that she was taken off of the Depo before because of her irregular bleeding. Discussed other contraception methods but pt stated that she doesn't want to try any other methods. Per Heather Hogan,CNM, pt can take 800 mg Ibuprofen q8h x 5 days. Pt advised to call the office if she has any more questions. Pt verbalized understanding and had no questions.

## 2017-06-10 ENCOUNTER — Encounter (HOSPITAL_COMMUNITY): Payer: Self-pay

## 2017-06-10 ENCOUNTER — Other Ambulatory Visit: Payer: Self-pay

## 2017-06-10 ENCOUNTER — Emergency Department (HOSPITAL_COMMUNITY)
Admission: EM | Admit: 2017-06-10 | Discharge: 2017-06-10 | Disposition: A | Discharge status: Home / Self Care | Payer: Medicaid Other | Attending: Emergency Medicine | Admitting: Emergency Medicine

## 2017-06-10 DIAGNOSIS — H00011 Hordeolum externum right upper eyelid: Secondary | ICD-10-CM | POA: Insufficient documentation

## 2017-06-10 DIAGNOSIS — Z79899 Other long term (current) drug therapy: Secondary | ICD-10-CM | POA: Insufficient documentation

## 2017-06-10 DIAGNOSIS — F17210 Nicotine dependence, cigarettes, uncomplicated: Secondary | ICD-10-CM | POA: Insufficient documentation

## 2017-06-10 NOTE — ED Notes (Signed)
Pt states that she was treated for a stye on her R eyelid in November, but the cream she was given was making the bumps worse. Pt states that she has had a bump on her left eyelid for 6 years and it is not painful. States that warm compresses and eye drops are no longer helping with the pain and drainage.

## 2017-06-10 NOTE — ED Triage Notes (Signed)
Patient complains of recurrent styes to bilateral eye lids. Has tried the medications prescribed with no relief

## 2017-06-10 NOTE — Discharge Instructions (Addendum)
Please stop  using any make up around your eyes.  I recommend you throw away all your eye make up and get new make up.  Please use warm compresses on your eyes.   Please take Ibuprofen (Advil, motrin) and Tylenol (acetaminophen) to relieve your pain.  You may take up to 600 MG (3 pills) of normal strength ibuprofen every 8 hours as needed.  In between doses of ibuprofen you make take tylenol, up to 1,000 mg (two extra strength pills).  Do not take more than 3,000 mg tylenol in a 24 hour period.  Please check all medication labels as many medications such as pain and cold medications may contain tylenol.  Do not drink alcohol while taking these medications.  Do not take other NSAID'S while taking ibuprofen (such as aleve or naproxen).  Please take ibuprofen with food to decrease stomach upset.

## 2017-06-10 NOTE — ED Provider Notes (Signed)
[facility] EMERGENCY DEPARTMENT Provider Note   CSN: [id number] Arrival date & time: 06/10/17  1201     History   Chief Complaint No chief complaint on file.   HPI Misty Ewing is a 27 y.o. female who presents today for evaluation of a stye in her right eye.  She reports that she has been seen for similar in the past and was given erythromycin ointment and reports that this is not getting better.  She reports that currently her stye has been present for a few days and is gradually worsening.  She reports that it popped and drained this morning.  She denies any fevers or chills.  She reports that she has been using warm compresses.  She says that she has previously been seen by an eye doctor however they referred her to the emergency room for these.  HPI  Past Medical History:  Diagnosis Date  . Depression     Patient Active Problem List   Diagnosis Date Noted  . Trichomoniasis 02/14/2017  . Initiation of Depo Provera 02/12/2017  . Pregnancy examination or test, negative result 02/12/2017  . Annual physical exam 02/12/2017    Past Surgical History:  Procedure Laterality Date  . NO PAST SURGERIES       OB History    Gravida  4   Para  4   Term  3   Preterm  1   AB  0   Living  4     SAB  0   TAB  0   Ectopic  0   Multiple  0   Live Births  1            Home Medications    Prior to Admission medications   Medication Sig Start Date End Date Taking? Authorizing Provider  aspirin-acetaminophen-caffeine (EXCEDRIN MIGRAINE) [phone] MG tablet Take 2 tablets by mouth every 6 (six) hours as needed for headache.    [provider]  clindamycin (CLEOCIN) 150 MG capsule Take 2 capsules (300 mg total) by mouth 3 (three) times daily. Patient not taking: Reported on 02/12/2017 06/02/16   Jaynie CrumbleKirichenko, Tatyana, PA-C  ibuprofen (ADVIL,MOTRIN) 800 MG tablet Take 1 tablet (800 mg total) by mouth 3 (three) times daily. 02/08/17    Georgiana ShoreMitchell, Jessica B, PA-C  methocarbamol (ROBAXIN) 500 MG tablet Take 1 tablet (500 mg total) by mouth at bedtime as needed for muscle spasms. Patient not taking: Reported on 02/12/2017 02/08/17   Mathews RobinsonsMitchell, Jessica B, PA-C  metroNIDAZOLE (FLAGYL) 500 MG tablet Take 1 tablet (500 mg total) by mouth 2 (two) times daily. 02/14/17   Marylene LandKooistra, Kathryn Lorraine, CNM  naproxen (NAPROSYN) 500 MG tablet Take 1 tablet (500 mg total) by mouth 2 (two) times daily. Patient not taking: Reported on 02/12/2017 11/25/16   Mathews RobinsonsMitchell, Jessica B, PA-C  ondansetron (ZOFRAN ODT) 4 MG disintegrating tablet Take 1 tablet (4 mg total) by mouth every 8 (eight) hours as needed for nausea or vomiting. Patient not taking: Reported on 02/12/2017 10/04/15   Alvira MondaySchlossman, Erin, MD  traMADol (ULTRAM) 50 MG tablet Take 1 tablet (50 mg total) by mouth every 6 (six) hours as needed. Patient not taking: Reported on 02/12/2017 06/02/16   Jaynie CrumbleKirichenko, Tatyana, PA-C    Family History Family History  Problem Relation Age of Onset  . Anesthesia problems Neg Hx   . Hypotension Neg Hx   . Malignant hyperthermia Neg Hx   . Pseudochol deficiency Neg Hx     Social  History Social History   Tobacco Use  . Smoking status: Current Every Day Smoker    Packs/day: 1.00    Types: Cigarettes  . Smokeless tobacco: Never Used  Substance Use Topics  . Alcohol use: No  . Drug use: No     Allergies   Bee venom and Penicillins   Review of Systems Review of Systems  Constitutional: Negative for chills and fever.  HENT: Negative for facial swelling.   Eyes: Negative for pain, discharge and itching.       Swelling above bilateral eyelids new swelling on right eyelid.  All other systems reviewed and are negative.    Physical Exam Updated Vital Signs BP 108/66   Pulse 80   Temp 98.2 F (36.8 C) (Oral)   Resp 18   SpO2 99%   Physical Exam  Constitutional: She appears well-developed and well-nourished.  HENT:  Head: Normocephalic and  atraumatic.  Mouth/Throat: Oropharynx is clear and moist.  No obvious facial swelling.  No periorbital erythema or swelling.  Eyes: Pupils are equal, round, and reactive to light. Conjunctivae and EOM are normal. Right eye exhibits hordeolum (Acute). Right eye exhibits no discharge. Left eye exhibits hordeolum. Left eye exhibits no discharge.  Patient has evidence of multiple styes over her bilateral eyelids, currently on right medial eye is mildly red, scant drainage from the medial stye.  No surrounding redness, induration or fluctuance.   Neck: Normal range of motion.  Neurological: She is alert.  Skin: Skin is warm and dry. She is not diaphoretic.  Psychiatric: She has a normal mood and affect. Her behavior is normal.  Nursing note and vitals reviewed.    ED Treatments / Results  Labs (all labs ordered are listed, but only abnormal results are displayed) Labs Reviewed - No data to display  EKG None  Radiology No results found.  Procedures Procedures (including critical care time)  Medications Ordered in ED Medications - No data to display   Initial Impression / Assessment and Plan / ED Course  I have reviewed the triage vital signs and the nursing notes.  Pertinent labs & imaging results that were available during my care of the patient were reviewed by me and considered in my medical decision making (see chart for details).  Clinical Course as of Jun 11 1519  Wed Jun 10, 2017  1353 While in room attempting to examine patient she took a phone call and was having extended conversation with her boss.  No distress   [EH]    Clinical Course User Index [EH] Cristina Gong, PA-C   Physical exam with bilateral upper lid erythema, tenderness and mild swelling without injection of the cornea or conjunctiva, consistent with external hordeolum.  No purulent discharge.   No induration of the eyelid to suggest periorbital cellulitis. Presentation non-concerning for iritis,  bacterial conjunctivitis, corneal abrasions, or HSV.   Discussed use of warm compresses. Patient has erythromycin eye ointment at home.  Personal hygiene and frequent handwashing also discussed.  Patient advised to followup with ophthalmologist due to recurrent nature of her symptoms.       Final Clinical Impressions(s) / ED Diagnoses   Final diagnoses:  Hordeolum externum of right upper eyelid    ED Discharge Orders    None       Cristina Gong, Cordelia Poche 06/10/17 1523    Phineas Real Latanya Maudlin, MD 06/10/17 1531

## 2017-07-16 ENCOUNTER — Ambulatory Visit: Payer: Medicaid Other

## 2017-07-18 ENCOUNTER — Other Ambulatory Visit: Payer: Self-pay

## 2017-07-18 ENCOUNTER — Encounter (HOSPITAL_COMMUNITY): Payer: Self-pay

## 2017-07-18 ENCOUNTER — Emergency Department (HOSPITAL_COMMUNITY)
Admission: EM | Admit: 2017-07-18 | Discharge: 2017-07-18 | Disposition: A | Discharge status: Home / Self Care | Payer: Medicaid Other | Attending: Emergency Medicine | Admitting: Emergency Medicine

## 2017-07-18 DIAGNOSIS — Z5321 Procedure and treatment not carried out due to patient leaving prior to being seen by health care provider: Secondary | ICD-10-CM | POA: Insufficient documentation

## 2017-07-18 DIAGNOSIS — R109 Unspecified abdominal pain: Secondary | ICD-10-CM | POA: Insufficient documentation

## 2017-07-18 DIAGNOSIS — R112 Nausea with vomiting, unspecified: Secondary | ICD-10-CM | POA: Insufficient documentation

## 2017-07-18 LAB — COMPREHENSIVE METABOLIC PANEL
ALBUMIN: 4 g/dL (ref 3.5–5.0)
ALK PHOS: 33 U/L — AB (ref 38–126)
ALT: 11 U/L — ABNORMAL LOW (ref 14–54)
ANION GAP: 7 (ref 5–15)
AST: 18 U/L (ref 15–41)
BUN: 13 mg/dL (ref 6–20)
CALCIUM: 9.1 mg/dL (ref 8.9–10.3)
CO2: 22 mmol/L (ref 22–32)
Chloride: 112 mmol/L — ABNORMAL HIGH (ref 101–111)
Creatinine, Ser: 0.74 mg/dL (ref 0.44–1.00)
GFR calc non Af Amer: >60 mL/min (ref >60)
Glucose, Bld: 82 mg/dL (ref 65–99)
POTASSIUM: 3.6 mmol/L (ref 3.5–5.1)
Sodium: 141 mmol/L (ref 135–145)
Total Bilirubin: 0.6 mg/dL (ref 0.3–1.2)
Total Protein: 7.4 g/dL (ref 6.5–8.1)

## 2017-07-18 LAB — URINALYSIS, ROUTINE W REFLEX MICROSCOPIC
BILIRUBIN URINE: NEGATIVE
Glucose, UA: NEGATIVE mg/dL
Ketones, ur: NEGATIVE mg/dL
LEUKOCYTES UA: NEGATIVE
Nitrite: POSITIVE — AB
PROTEIN: NEGATIVE mg/dL
Specific Gravity, Urine: 1.023 (ref 1.005–1.030)
pH: 7 (ref 5.0–8.0)

## 2017-07-18 LAB — CBC
HEMATOCRIT: 42.1 % (ref 36.0–46.0)
HEMOGLOBIN: 14.6 g/dL (ref 12.0–15.0)
MCH: 31.6 pg (ref 26.0–34.0)
MCHC: 34.7 g/dL (ref 30.0–36.0)
MCV: 91.1 fL (ref 78.0–100.0)
Platelets: 238 10*3/uL (ref 150–400)
RBC: 4.62 MIL/uL (ref 3.87–5.11)
RDW: 13 % (ref 11.5–15.5)
WBC: 6.6 10*3/uL (ref 4.0–10.5)

## 2017-07-18 LAB — LIPASE, BLOOD: LIPASE: 34 U/L (ref 11–51)

## 2017-07-18 LAB — I-STAT BETA HCG BLOOD, ED (MC, WL, AP ONLY)

## 2017-07-18 NOTE — ED Triage Notes (Signed)
Pt endorses n/v/d and abd since yesterday. VSS, afebrile.

## 2017-07-18 NOTE — ED Notes (Signed)
At the desk requesting update on wait.  States I can't wait any longer I'll come back tomorrow.  Encouraged to wait without success.  Left at this time.

## 2017-07-19 ENCOUNTER — Other Ambulatory Visit: Payer: Self-pay

## 2017-07-19 ENCOUNTER — Emergency Department (HOSPITAL_COMMUNITY)
Admission: EM | Admit: 2017-07-19 | Discharge: 2017-07-19 | Disposition: A | Discharge status: Home / Self Care | Payer: Medicaid Other | Attending: Emergency Medicine | Admitting: Emergency Medicine

## 2017-07-19 ENCOUNTER — Encounter (HOSPITAL_COMMUNITY): Payer: Self-pay | Admitting: Emergency Medicine

## 2017-07-19 DIAGNOSIS — R197 Diarrhea, unspecified: Secondary | ICD-10-CM | POA: Insufficient documentation

## 2017-07-19 DIAGNOSIS — R112 Nausea with vomiting, unspecified: Secondary | ICD-10-CM

## 2017-07-19 DIAGNOSIS — Z0279 Encounter for issue of other medical certificate: Secondary | ICD-10-CM | POA: Insufficient documentation

## 2017-07-19 DIAGNOSIS — F17210 Nicotine dependence, cigarettes, uncomplicated: Secondary | ICD-10-CM | POA: Insufficient documentation

## 2017-07-19 DIAGNOSIS — R109 Unspecified abdominal pain: Secondary | ICD-10-CM | POA: Insufficient documentation

## 2017-07-19 MED ORDER — ONDANSETRON 4 MG PO TBDP: 4.0000 mg | ORAL_TABLET | Freq: Three times a day (TID) | ORAL | 0 refills | Status: DC | PRN

## 2017-07-19 NOTE — ED Provider Notes (Signed)
[facility] EMERGENCY DEPARTMENT Provider Note   CSN: [id number] Arrival date & time: 07/19/17  1140     History   Chief Complaint Chief Complaint  Patient presents with  . Emesis    HPI Misty Ewing is a 27 y.o. female who presents with vomiting and diarrhea.  No significant past medical history.  Patient states that she started having multiple episodes of nonbloody nonbilious vomiting and diarrhea starting yesterday.  She tried Pepto-Bismol which provided temporary relief however her symptoms returned.  She came to the emergency department yesterday but left without being seen due to wait times.  She reports intermittent abdominal cramping that is not severe.  She returned today because she is would like a work note for today and yesterday and wants to know if she is pregnant. She take Depo for birth control.  She denies fever, chills, severe abdominal pain, urinary symptoms, vaginal discharge.  She reports that the frequency of the vomiting and diarrhea has decreased.  She reports a sick contact at work with someone who had similar symptoms.  HPI  Past Medical History:  Diagnosis Date  . Depression     Patient Active Problem List   Diagnosis Date Noted  . Trichomoniasis 02/14/2017  . Initiation of Depo Provera 02/12/2017  . Pregnancy examination or test, negative result 02/12/2017  . Annual physical exam 02/12/2017    Past Surgical History:  Procedure Laterality Date  . NO PAST SURGERIES       OB History    Gravida  4   Para  4   Term  3   Preterm  1   AB  0   Living  4     SAB  0   TAB  0   Ectopic  0   Multiple  0   Live Births  1            Home Medications    Prior to Admission medications   Medication Sig Start Date End Date Taking? Authorizing Provider  aspirin-acetaminophen-caffeine (EXCEDRIN MIGRAINE) [phone] MG tablet Take 2 tablets by mouth every 6 (six) hours as needed for headache.    [provider]  clindamycin (CLEOCIN) 150 MG capsule Take 2 capsules (300 mg total) by mouth 3 (three) times daily. Patient not taking: Reported on 02/12/2017 06/02/16   Jaynie Crumble, PA-C  ibuprofen (ADVIL,MOTRIN) 800 MG tablet Take 1 tablet (800 mg total) by mouth 3 (three) times daily. 02/08/17   Georgiana Shore, PA-C  methocarbamol (ROBAXIN) 500 MG tablet Take 1 tablet (500 mg total) by mouth at bedtime as needed for muscle spasms. Patient not taking: Reported on 02/12/2017 02/08/17   Mathews Robinsons B, PA-C  metroNIDAZOLE (FLAGYL) 500 MG tablet Take 1 tablet (500 mg total) by mouth 2 (two) times daily. 02/14/17   Marylene Land, CNM  naproxen (NAPROSYN) 500 MG tablet Take 1 tablet (500 mg total) by mouth 2 (two) times daily. Patient not taking: Reported on 02/12/2017 11/25/16   Mathews Robinsons B, PA-C  ondansetron (ZOFRAN ODT) 4 MG disintegrating tablet Take 1 tablet (4 mg total) by mouth every 8 (eight) hours as needed for nausea or vomiting. Patient not taking: Reported on 02/12/2017 10/04/15   Alvira Monday, MD  traMADol (ULTRAM) 50 MG tablet Take 1 tablet (50 mg total) by mouth every 6 (six) hours as needed. Patient not taking: Reported on 02/12/2017 06/02/16   Jaynie Crumble, PA-C    Family History Family History  Problem Relation Age of Onset  . Anesthesia problems Neg Hx   . Hypotension Neg Hx   . Malignant hyperthermia Neg Hx   . Pseudochol deficiency Neg Hx     Social History Social History   Tobacco Use  . Smoking status: Current Every Day Smoker    Packs/day: 1.00    Types: Cigarettes  . Smokeless tobacco: Never Used  Substance Use Topics  . Alcohol use: No  . Drug use: No     Allergies   Bee venom and Penicillins   Review of Systems Review of Systems  Constitutional: Negative for chills and fever.  Respiratory: Negative for shortness of breath.   Cardiovascular: Negative for chest pain.  Gastrointestinal: Positive for abdominal  pain (intermittent), diarrhea, nausea and vomiting.  Genitourinary: Positive for vaginal bleeding (spotting). Negative for difficulty urinating, dysuria, flank pain, hematuria, pelvic pain and vaginal discharge.  All other systems reviewed and are negative.    Physical Exam Updated Vital Signs BP 115/70 (BP Location: Right Arm)   Pulse 88   Temp 98.2 F (36.8 C)   Resp 14   SpO2 100%   Physical Exam  Constitutional: She is oriented to person, place, and time. She appears well-developed and well-nourished. No distress.  Well-appearing  HENT:  Head: Normocephalic and atraumatic.  Eyes: Pupils are equal, round, and reactive to light. Conjunctivae are normal. Right eye exhibits no discharge. Left eye exhibits no discharge. No scleral icterus.  Neck: Normal range of motion.  Cardiovascular: Normal rate and regular rhythm.  Pulmonary/Chest: Effort normal and breath sounds normal. No respiratory distress.  Abdominal: Soft. Bowel sounds are normal. She exhibits no distension. There is no tenderness.  Neurological: She is alert and oriented to person, place, and time.  Skin: Skin is warm and dry.  Psychiatric: She has a normal mood and affect. Her behavior is normal.  Nursing note and vitals reviewed.    ED Treatments / Results  Labs (all labs ordered are listed, but only abnormal results are displayed) Labs Reviewed  URINE CULTURE    EKG None  Radiology No results found.  Procedures Procedures (including critical care time)  Medications Ordered in ED Medications - No data to display   Initial Impression / Assessment and Plan / ED Course  I have reviewed the triage vital signs and the nursing notes.  Pertinent labs & imaging results that were available during my care of the patient were reviewed by me and considered in my medical decision making (see chart for details).  27 year old female with nausea, vomiting, diarrhea and intermittent abdominal cramping consistent  with viral gastroenteritis.  Her vital signs are normal.  She is well-appearing.  Her abdomen is soft and nontender.  Also consider UTI since her urine yesterday showed moderate hemoglobin, positive nitrates, 21-50 white blood cells however there were rare bacteria and no leukocytes.  She has no urinary symptoms.  We will send a urine culture and advised her that she would be notified if this was abnormal.  Her pregnancy test is negative all other lab work is reassuring.  She was given a prescription for Zofran and a work note.  Return precautions were given.  Final Clinical Impressions(s) / ED Diagnoses   Final diagnoses:  Nausea vomiting and diarrhea    ED Discharge Orders    None       Bethel Born, PA-C 07/19/17 1347    Linwood Dibbles, MD 07/21/17 1759

## 2017-07-19 NOTE — Discharge Instructions (Signed)
Please drink plenty of fluids Take zofran for nausea/vomiting You will be called if your urine culture comes back abnormal

## 2017-07-19 NOTE — ED Triage Notes (Signed)
Patient complains of vomiting and diarrhea that started yesterday morning. States she took pepto-bismal with brief relief. States she was here last night, had blood and urine tested, but did not wait for results or to see a provider.

## 2017-07-20 LAB — URINE CULTURE

## 2017-12-21 ENCOUNTER — Emergency Department (HOSPITAL_COMMUNITY)
Admission: EM | Admit: 2017-12-21 | Discharge: 2017-12-21 | Disposition: A | Discharge status: Home / Self Care | Payer: Self-pay | Attending: Emergency Medicine | Admitting: Emergency Medicine

## 2017-12-21 ENCOUNTER — Encounter: Payer: Self-pay | Admitting: Emergency Medicine

## 2017-12-21 DIAGNOSIS — Z5321 Procedure and treatment not carried out due to patient leaving prior to being seen by health care provider: Secondary | ICD-10-CM | POA: Insufficient documentation

## 2017-12-21 DIAGNOSIS — K649 Unspecified hemorrhoids: Secondary | ICD-10-CM | POA: Insufficient documentation

## 2017-12-21 NOTE — ED Triage Notes (Signed)
Pt states she has had a hemorrhoid for 3 days and that it "bust" this morning. Small amount of blood to her underwear and toilet tissue.

## 2017-12-21 NOTE — ED Notes (Signed)
RN attempted to call pt back to room x 3 with no asnwer

## 2017-12-23 ENCOUNTER — Encounter (HOSPITAL_COMMUNITY): Payer: Self-pay | Admitting: *Deleted

## 2017-12-23 ENCOUNTER — Emergency Department (HOSPITAL_COMMUNITY)
Admission: EM | Admit: 2017-12-23 | Discharge: 2017-12-23 | Disposition: A | Discharge status: Home / Self Care | Payer: Medicaid Other | Attending: Emergency Medicine | Admitting: Emergency Medicine

## 2017-12-23 DIAGNOSIS — K649 Unspecified hemorrhoids: Secondary | ICD-10-CM

## 2017-12-23 DIAGNOSIS — F17210 Nicotine dependence, cigarettes, uncomplicated: Secondary | ICD-10-CM | POA: Insufficient documentation

## 2017-12-23 DIAGNOSIS — Z7982 Long term (current) use of aspirin: Secondary | ICD-10-CM | POA: Insufficient documentation

## 2017-12-23 MED ORDER — OXYCODONE-ACETAMINOPHEN 5-325 MG PO TABS: 2.0000 | ORAL_TABLET | Freq: Three times a day (TID) | ORAL | 0 refills | Status: DC | PRN

## 2017-12-23 MED ORDER — HYDROCORTISONE ACE-PRAMOXINE 1-1 % RE FOAM: 1.0000 | Freq: Two times a day (BID) | RECTAL | 0 refills | Status: DC

## 2017-12-23 MED ORDER — LIDOCAINE HCL URETHRAL/MUCOSAL 2 % EX GEL
1.0000 "application " | Freq: Once | CUTANEOUS | Status: AC
  Administered 2017-12-23: 1 via TOPICAL
  Filled 2017-12-23: qty 20

## 2017-12-23 NOTE — ED Provider Notes (Signed)
Patient placed in Quick Look pathway, seen and evaluated   Chief Complaint: Hemorrhoids  HPI:   Patient reports that she has been having bright red blood per rectum. She reports that she has had this since the 12th.  She reports that the bright red blood has been small amounts and started today.  She denies possibility of pregnancy stating "you gotta have sex to be pregnant." No bowel movement since Saturday, says is afraid to do so  ROS: No fevers (one)  Physical Exam:   Gen: No distress  Neuro: Awake and Alert  Skin: Warm    Focused Exam: Deferred   Initiation of care has begun. The patient has been counseled on the process, plan, and necessity for staying for the completion/evaluation, and the remainder of the medical screening examination    Norman Clay 12/23/17 1819    Melene Plan, DO 12/23/17 2256

## 2017-12-23 NOTE — ED Notes (Signed)
Patient verbalizes understanding of discharge instructions. Opportunity for questioning and answers were provided. Armband removed by staff, pt discharged from ED.  

## 2017-12-23 NOTE — Discharge Instructions (Addendum)
Please take medications as directed.  Please complete warm sitz bath at home to help with your pain.  If you are constipated then you will need to start taking 1 capful of MiraLAX daily until your stools are soft.  Monitor your symptoms, if symptoms worsen over the next 48 hours then you should return for reevaluation.  If you have any fevers, chills, white/pus drainage from the wound, if the wound worsens or becomes larger than you should also return to the ED for evaluation.

## 2017-12-23 NOTE — ED Provider Notes (Signed)
[facility] EMERGENCY DEPARTMENT Provider Note   CSN: [id number] Arrival date & time: 12/23/17  1801     History   Chief Complaint Chief Complaint  Patient presents with  . Hemorrhoids    HPI CELESE BANNER is a 27 y.o. female.  HPI   Pt is a 27 y/o female with a h/o depression who presents to the ED today complaining of rectal pain that has been present for the last several days.  Reports she has a history of external hemorrhoids.  States pain was initially very severe, today she noticed bleeding from her rectum on the toilet tissue.  States that there was no white/purulent drainage from the area, but that it smelled like pus.  She denies any fevers or chills.  States that her pain has improved since this occurred.  She denies any blood in her stools.  No abdominal pain, nausea, vomiting, diarrhea or urinary symptoms.  She has had some constipation and has not had a bowel movement in several days because she is scared due to the pain.  Prior to the onset of pain she had hard stools.  She has tried Preparation H and hemorrhoid wipes with no significant relief.  Past Medical History:  Diagnosis Date  . Depression     Patient Active Problem List   Diagnosis Date Noted  . Trichomoniasis 02/14/2017  . Initiation of Depo Provera 02/12/2017  . Pregnancy examination or test, negative result 02/12/2017  . Annual physical exam 02/12/2017    Past Surgical History:  Procedure Laterality Date  . NO PAST SURGERIES       OB History    Gravida  4   Para  4   Term  3   Preterm  1   AB  0   Living  4     SAB  0   TAB  0   Ectopic  0   Multiple  0   Live Births  1            Home Medications    Prior to Admission medications   Medication Sig Start Date End Date Taking? Authorizing Provider  aspirin-acetaminophen-caffeine (EXCEDRIN MIGRAINE) [phone] MG tablet Take 2 tablets by mouth every 6 (six) hours as needed for headache.     [provider]  hydrocortisone-pramoxine (PROCTOFOAM HC) rectal foam Place 1 applicator rectally 2 (two) times daily. 12/23/17   Junior Kenedy S, PA-C  ibuprofen (ADVIL,MOTRIN) 800 MG tablet Take 1 tablet (800 mg total) by mouth 3 (three) times daily. 02/08/17   Georgiana Shore, PA-C  metroNIDAZOLE (FLAGYL) 500 MG tablet Take 1 tablet (500 mg total) by mouth 2 (two) times daily. 02/14/17   Marylene Land, CNM  ondansetron (ZOFRAN ODT) 4 MG disintegrating tablet Take 1 tablet (4 mg total) by mouth every 8 (eight) hours as needed for nausea or vomiting. 07/19/17   Bethel Born, PA-C  oxyCODONE-acetaminophen (PERCOCET/ROXICET) 5-325 MG tablet Take 2 tablets by mouth every 8 (eight) hours as needed for severe pain. 12/23/17   Laverne Hursey S, PA-C    Family History Family History  Problem Relation Age of Onset  . Anesthesia problems Neg Hx   . Hypotension Neg Hx   . Malignant hyperthermia Neg Hx   . Pseudochol deficiency Neg Hx     Social History Social History   Tobacco Use  . Smoking status: Current Every Day Smoker    Packs/day: 1.00    Types: Cigarettes  .  Smokeless tobacco: Never Used  Substance Use Topics  . Alcohol use: No  . Drug use: No     Allergies   Bee venom and Penicillins   Review of Systems Review of Systems  Constitutional: Negative for chills and fever.  HENT: Negative for ear pain and sore throat.   Eyes: Negative for pain and visual disturbance.  Respiratory: Negative for cough and shortness of breath.   Cardiovascular: Negative for chest pain.  Gastrointestinal: Positive for constipation and rectal pain. Negative for abdominal pain, diarrhea, nausea and vomiting.  Genitourinary: Negative for dysuria and hematuria.  Musculoskeletal: Negative for back pain.  Skin: Negative for color change and rash.  Neurological: Negative for seizures and syncope.  All other systems reviewed and are negative.    Physical Exam Updated  Vital Signs BP 108/70   Pulse 68   Temp 98.3 F (36.8 C) (Oral)   Resp 16   Ht 5\' 4"  (1.626 m)   Wt 29.8 kg   LMP 12/23/2017 (LMP Unknown)   SpO2 99%   BMI 11.29 kg/m   Physical Exam  Constitutional: She appears well-developed and well-nourished. No distress.  HENT:  Head: Normocephalic and atraumatic.  Eyes: Conjunctivae are normal.  Neck: Neck supple.  Cardiovascular: Normal rate.  Pulmonary/Chest: Effort normal.  Abdominal: Soft.  Genitourinary:  Genitourinary Comments: Chaperone present during digital rectal exam.  Patient has a 1 cm external hemorrhoid to the 6 o'clock position that appears to have a small blood clot protruding from the hemorrhoid.  There is no purulent drainage.  There is very well-defined, does not appear consistent with abscess.  Internal hemorrhoid present.  Musculoskeletal: She exhibits no edema.  Neurological: She is alert.  Skin: Skin is warm and dry.  Psychiatric: She has a normal mood and affect.  Nursing note and vitals reviewed.  ED Treatments / Results  Labs (all labs ordered are listed, but only abnormal results are displayed) Labs Reviewed - No data to display  EKG None  Radiology No results found.  Procedures Procedures (including critical care time)  Medications Ordered in ED Medications  lidocaine (XYLOCAINE) 2 % jelly 1 application (1 application Topical Given 12/23/17 1916)     Initial Impression / Assessment and Plan / ED Course  I have reviewed the triage vital signs and the nursing notes.  Pertinent labs & imaging results that were available during my care of the patient were reviewed by me and considered in my medical decision making (see chart for details).   Record reviewed in [place] narcotic database and patient has no red flags.   Final Clinical Impressions(s) / ED Diagnoses   Final diagnoses:  Bleeding hemorrhoids   Patient presents the emergency department today complaining of rectal pain  secondary to external hemorrhoids.  States that she had some blood on the toilet tissue from the hemorrhoid.  No bloody stools.  On exam patient has 1 cm hemorrhoid externally that appears to have a small blood clot protruding from it.  There is no purulent drainage noted.  There is no sign of perirectal abscess on exam.  Will give patient pain medication and Proctofoam Rx.  Will give referral to general surgery and have patient follow-up as an outpatient.  Advised that if in 48 hours symptoms have worsened or area has increased in size then she will need to return to the emergency department for reevaluation.  Advised her to return sooner for any signs of infection or worsening symptoms.  She voices an  understanding of the plan and reasons to return immediately to the ED.  All questions answered.  ED Discharge Orders         Ordered    hydrocortisone-pramoxine (PROCTOFOAM [facility]) rectal foam  2 times daily     12/23/17 2046    oxyCODONE-acetaminophen (PERCOCET/ROXICET) 5-325 MG tablet  Every 8 hours PRN     12/23/17 2046           Karrie Meres, PA-C 12/23/17 2054    Pricilla Loveless, MD 12/23/17 2203

## 2017-12-23 NOTE — ED Triage Notes (Signed)
Pt states that she has hemorrhoids since the 12th. Pt states that she has had bright red bleeding. Pt states that she has had bleeding into her underwear and toilet tissue. Pt states that she "smells pus". Pt has not had a bowel movement.

## 2018-03-01 ENCOUNTER — Other Ambulatory Visit: Payer: Self-pay

## 2018-03-01 ENCOUNTER — Emergency Department (HOSPITAL_COMMUNITY)
Admission: EM | Admit: 2018-03-01 | Discharge: 2018-03-01 | Disposition: A | Discharge status: Home / Self Care | Payer: Medicaid Other | Attending: Emergency Medicine | Admitting: Emergency Medicine

## 2018-03-01 DIAGNOSIS — H9209 Otalgia, unspecified ear: Secondary | ICD-10-CM | POA: Insufficient documentation

## 2018-03-01 DIAGNOSIS — R6884 Jaw pain: Secondary | ICD-10-CM | POA: Insufficient documentation

## 2018-03-01 DIAGNOSIS — R51 Headache: Secondary | ICD-10-CM | POA: Insufficient documentation

## 2018-03-01 DIAGNOSIS — F17210 Nicotine dependence, cigarettes, uncomplicated: Secondary | ICD-10-CM | POA: Insufficient documentation

## 2018-03-01 DIAGNOSIS — R519 Headache, unspecified: Secondary | ICD-10-CM

## 2018-03-01 LAB — I-STAT CHEM 8, ED
BUN: 11 mg/dL (ref 6–20)
Calcium, Ion: 1.21 mmol/L (ref 1.15–1.40)
Chloride: 106 mmol/L (ref 98–111)
Creatinine, Ser: 0.5 mg/dL (ref 0.44–1.00)
Glucose, Bld: 82 mg/dL (ref 70–99)
HCT: 43 % (ref 36.0–46.0)
Hemoglobin: 14.6 g/dL (ref 12.0–15.0)
Potassium: 3.6 mmol/L (ref 3.5–5.1)
Sodium: 140 mmol/L (ref 135–145)
TCO2: 25 mmol/L (ref 22–32)

## 2018-03-01 LAB — I-STAT BETA HCG BLOOD, ED (MC, WL, AP ONLY): I-stat hCG, quantitative: <5 m[IU]/mL (ref <5)

## 2018-03-01 MED ORDER — KETOROLAC TROMETHAMINE 15 MG/ML IJ SOLN
15.0000 mg | Freq: Once | INTRAMUSCULAR | Status: AC
  Administered 2018-03-01: 15 mg via INTRAVENOUS
  Filled 2018-03-01: qty 1

## 2018-03-01 MED ORDER — DEXAMETHASONE SODIUM PHOSPHATE 10 MG/ML IJ SOLN
10.0000 mg | Freq: Once | INTRAMUSCULAR | Status: AC
  Administered 2018-03-01: 10 mg via INTRAVENOUS
  Filled 2018-03-01: qty 1

## 2018-03-01 MED ORDER — DIPHENHYDRAMINE HCL 50 MG/ML IJ SOLN
12.5000 mg | Freq: Once | INTRAMUSCULAR | Status: AC
  Administered 2018-03-01: 12.5 mg via INTRAVENOUS
  Filled 2018-03-01: qty 1

## 2018-03-01 MED ORDER — METOCLOPRAMIDE HCL 5 MG/ML IJ SOLN
10.0000 mg | Freq: Once | INTRAMUSCULAR | Status: AC
  Administered 2018-03-01: 10 mg via INTRAVENOUS
  Filled 2018-03-01: qty 2

## 2018-03-01 MED ORDER — SODIUM CHLORIDE 0.9 % IV BOLUS
1000.0000 mL | Freq: Once | INTRAVENOUS | Status: AC
  Administered 2018-03-01: 1000 mL via INTRAVENOUS

## 2018-03-01 NOTE — ED Provider Notes (Signed)
[facility] Provider [facility]   CSN: [id number] Arrival date & time: 03/01/18  1417     History   Chief Complaint Chief Complaint  Patient presents with  . Headache    HPI Misty Ewing is a 27 y.o. female who presents with a headache. No significant PMH. She states that she's had a constant headache for the past 2 weeks. It is over the right side of her head and radiates to her right ear, right eye, right jaw. She states that she's been taking Ibuprofen and Tylenol and it hasn't been helping. She hasn't been able to sleep and has been crying because of the pain. Lying on the right side will make it worse. Sometimes her vision will "go out" when she lies on the right side. She denies syncope, fever, neck pain, nausea, vomiting, photophobia. She has never had a headache like this before. She notes her jaw pain improves with OTC meds but her headache doesn't. She denies hx of migraines or family hx of migraines.    HPI  Past Medical History:  Diagnosis Date  . Depression     Patient Active Problem List   Diagnosis Date Noted  . Trichomoniasis 02/14/2017  . Initiation of Depo Provera 02/12/2017  . Pregnancy examination or test, negative result 02/12/2017  . Annual physical exam 02/12/2017    Past Surgical History:  Procedure Laterality Date  . NO PAST SURGERIES       OB History    Gravida  4   Para  4   Term  3   Preterm  1   AB  0   Living  4     SAB  0   TAB  0   Ectopic  0   Multiple  0   Live Births  1            Home Medications    Prior to Admission medications   Medication Sig Start Date End Date Taking? Authorizing Provider  aspirin-acetaminophen-caffeine (EXCEDRIN MIGRAINE) [phone] MG tablet Take 2 tablets by mouth every 6 (six) hours as needed for headache.    [provider]  hydrocortisone-pramoxine (PROCTOFOAM HC) rectal foam Place 1 applicator rectally 2 (two) times daily.  12/23/17   Couture, Cortni S, PA-C  ibuprofen (ADVIL,MOTRIN) 800 MG tablet Take 1 tablet (800 mg total) by mouth 3 (three) times daily. 02/08/17   Georgiana ShoreMitchell, Jessica B, PA-C  metroNIDAZOLE (FLAGYL) 500 MG tablet Take 1 tablet (500 mg total) by mouth 2 (two) times daily. 02/14/17   Marylene LandKooistra, Kathryn Lorraine, CNM  ondansetron (ZOFRAN ODT) 4 MG disintegrating tablet Take 1 tablet (4 mg total) by mouth every 8 (eight) hours as needed for nausea or vomiting. 07/19/17   Bethel BornGekas, Swain Acree Marie, PA-C  oxyCODONE-acetaminophen (PERCOCET/ROXICET) 5-325 MG tablet Take 2 tablets by mouth every 8 (eight) hours as needed for severe pain. 12/23/17   Couture, Cortni S, PA-C    Family History Family History  Problem Relation Age of Onset  . Anesthesia problems Neg Hx   . Hypotension Neg Hx   . Malignant hyperthermia Neg Hx   . Pseudochol deficiency Neg Hx     Social History Social History   Tobacco Use  . Smoking status: Current Every Day Smoker    Packs/day: 1.00    Types: Cigarettes  . Smokeless tobacco: Never Used  Substance Use Topics  . Alcohol use: No  . Drug use: No     Allergies  Bee venom and Penicillins   Review of Systems Review of Systems  Constitutional: Negative for fever.  HENT: Positive for ear pain. Negative for congestion and dental problem.        +jaw pain  Eyes: Negative for photophobia, pain and visual disturbance.  Neurological: Positive for headaches. Negative for dizziness, syncope, weakness and light-headedness.  All other systems reviewed and are negative.    Physical Exam Updated Vital Signs BP 107/64 (BP Location: Right Arm)   Pulse 70   Temp 98 F (36.7 C) (Oral)   Resp 18   SpO2 99%   Physical Exam Vitals signs and nursing note reviewed.  Constitutional:      General: She is not in acute distress.    Appearance: She is well-developed. She is not ill-appearing.     Comments: Calm, cooperative. Well appearing young female  HENT:     Head:  Normocephalic and atraumatic.     Jaw: Tenderness (over right TMJ) present.     Comments: Tenderness over right temple    Right Ear: Tympanic membrane normal.     Left Ear: Tympanic membrane normal.     Nose: Nose normal.     Mouth/Throat:     Mouth: Mucous membranes are moist.     Dentition: Normal dentition. No dental tenderness.  Eyes:     General: No scleral icterus.       Right eye: No discharge.        Left eye: No discharge.     Conjunctiva/sclera: Conjunctivae normal.     Pupils: Pupils are equal, round, and reactive to light.  Neck:     Musculoskeletal: Normal range of motion.  Cardiovascular:     Rate and Rhythm: Normal rate.  Pulmonary:     Effort: Pulmonary effort is normal. No respiratory distress.  Abdominal:     General: There is no distension.  Skin:    General: Skin is warm and dry.  Neurological:     Mental Status: She is alert and oriented to person, place, and time.  Psychiatric:        Behavior: Behavior normal.      ED Treatments / Results  Labs (all labs ordered are listed, but only abnormal results are displayed) Labs Reviewed  I-STAT CHEM 8, ED  I-STAT BETA HCG BLOOD, ED ([facility], [facility], [facility] ONLY)    EKG None  Radiology No results found.  Procedures Procedures (including critical care time)  Medications Ordered in ED Medications  sodium chloride 0.9 % bolus 1,000 mL (1,000 mLs Intravenous New Bag/Given 03/01/18 1810)  ketorolac (TORADOL) 15 MG/ML injection 15 mg (15 mg Intravenous Given 03/01/18 1801)  metoCLOPramide (REGLAN) injection 10 mg (10 mg Intravenous Given 03/01/18 1801)  diphenhydrAMINE (BENADRYL) injection 12.5 mg (12.5 mg Intravenous Given 03/01/18 1801)  dexamethasone (DECADRON) injection 10 mg (10 mg Intravenous Given 03/01/18 1802)     Initial Impression / Assessment and Plan / ED Course  I have reviewed the triage vital signs and the nursing notes.  Pertinent labs & imaging results that were available during my care of  the patient were reviewed by me and considered in my medical decision making (see chart for details).  27 year old female presents with a right sided headache for 2 weeks. She has never had this before. No improvement with OTC meds. She also has jaw and ear pain. She has jaw tenderness on exam which is likely from TMJ. No signs of ear or dental infection. Her neuro exam  is normal. Suspect tension vs migraine headache. Will give headache cocktail and obtain CT head.  6:40 PM Primary RN informed me that pt wants to leave. Rechecked pt. Her headache has resolved. She wants to go home without imaging. I feel this is reasonable since she has a normal exam. Return precautions given.  Final Clinical Impressions(s) / ED Diagnoses   Final diagnoses:  Bad headache    ED Discharge Orders    None       Bethel Born, PA-C 03/01/18 1841    Rolan Bucco, MD 03/01/18 [phone]

## 2018-03-01 NOTE — ED Triage Notes (Signed)
Pt states she has had headache intermittemently X2 weeks. States pain in her ear and right side of face as well. No relief with tylenol or ibuprofen.

## 2018-03-01 NOTE — Discharge Instructions (Signed)
Please return if worsening.

## 2018-03-01 NOTE — ED Notes (Addendum)
Patient to NF desk stating that she "can't sit here all day". She has a headache and "doesn't know what's wrong with her". Patient has decided to leave and will return tomorrow.

## 2018-03-03 ENCOUNTER — Encounter (HOSPITAL_COMMUNITY): Payer: Self-pay

## 2018-03-03 ENCOUNTER — Emergency Department (HOSPITAL_COMMUNITY): Payer: Medicaid Other

## 2018-03-03 ENCOUNTER — Emergency Department (HOSPITAL_COMMUNITY)
Admission: EM | Admit: 2018-03-03 | Discharge: 2018-03-03 | Disposition: A | Discharge status: Home / Self Care | Payer: Medicaid Other | Attending: Emergency Medicine | Admitting: Emergency Medicine

## 2018-03-03 DIAGNOSIS — R519 Headache, unspecified: Secondary | ICD-10-CM

## 2018-03-03 DIAGNOSIS — R51 Headache: Secondary | ICD-10-CM | POA: Insufficient documentation

## 2018-03-03 LAB — HCG, SERUM, QUALITATIVE: Preg, Serum: NEGATIVE

## 2018-03-03 MED ORDER — SODIUM CHLORIDE 0.9 % IV BOLUS
1000.0000 mL | Freq: Once | INTRAVENOUS | Status: AC
  Administered 2018-03-03: 1000 mL via INTRAVENOUS

## 2018-03-03 MED ORDER — KETOROLAC TROMETHAMINE 15 MG/ML IJ SOLN
15.0000 mg | Freq: Once | INTRAMUSCULAR | Status: AC
  Administered 2018-03-03: 15 mg via INTRAVENOUS
  Filled 2018-03-03: qty 1

## 2018-03-03 MED ORDER — METOCLOPRAMIDE HCL 5 MG/ML IJ SOLN
10.0000 mg | Freq: Once | INTRAMUSCULAR | Status: AC
  Administered 2018-03-03: 10 mg via INTRAVENOUS
  Filled 2018-03-03: qty 2

## 2018-03-03 MED ORDER — DIPHENHYDRAMINE HCL 50 MG/ML IJ SOLN
25.0000 mg | Freq: Once | INTRAMUSCULAR | Status: AC
  Administered 2018-03-03: 25 mg via INTRAVENOUS
  Filled 2018-03-03: qty 1

## 2018-03-03 NOTE — Discharge Instructions (Addendum)
I have provided a referral for the headache clinic. Please follow up as needed.

## 2018-03-03 NOTE — ED Notes (Signed)
Patient transported to CT 

## 2018-03-03 NOTE — ED Notes (Signed)
Patient verbalizes understanding of discharge instructions. Opportunity for questioning and answers were provided. Armband removed by staff, pt discharged from ED ambulatory.   

## 2018-03-03 NOTE — ED Provider Notes (Signed)
[facility] Provider Note   CSN: [id number] Arrival date & time: 03/03/18  1405     History   Chief Complaint Chief Complaint  Patient presents with  . Headache    HPI Misty Ewing is a 27 y.o. female.  27 y.o female with a PMH of Depression present to the ED via EMS with a chief complaint of headache x 2 weeks. She describes her pain as sharp and tension located on the right side of her head. She reports the pain is worse with movement and lying on the right side of her face. She has tried tylenol, ibuprofen, Excedrin, motrin and reports no relieve in symptoms. [facility] and after receiving headache cocktail patient requesting to go home. She states she woke up this evening and her headache had returned. Denies any photophobia, emesis, blood thinners.   The history is provided by the patient.  Headache   Pertinent negatives include no fever, no shortness of breath, no nausea and no vomiting.    Past Medical History:  Diagnosis Date  . Depression     Patient Active Problem List   Diagnosis Date Noted  . Trichomoniasis 02/14/2017  . Initiation of Depo Provera 02/12/2017  . Pregnancy examination or test, negative result 02/12/2017  . Annual physical exam 02/12/2017    Past Surgical History:  Procedure Laterality Date  . NO PAST SURGERIES       OB History    Gravida  4   Para  4   Term  3   Preterm  1   AB  0   Living  4     SAB  0   TAB  0   Ectopic  0   Multiple  0   Live Births  1            Home Medications    Prior to Admission medications   Medication Sig Start Date End Date Taking? Authorizing Provider  aspirin-acetaminophen-caffeine (EXCEDRIN MIGRAINE) [phone] MG tablet Take 2 tablets by mouth every 6 (six) hours as needed for headache.    [provider]  hydrocortisone-pramoxine (PROCTOFOAM HC) rectal foam Place 1 applicator rectally 2 (two) times  daily. 12/23/17   Couture, Cortni S, PA-C  ibuprofen (ADVIL,MOTRIN) 800 MG tablet Take 1 tablet (800 mg total) by mouth 3 (three) times daily. 02/08/17   Georgiana ShoreMitchell, Jessica B, PA-C  metroNIDAZOLE (FLAGYL) 500 MG tablet Take 1 tablet (500 mg total) by mouth 2 (two) times daily. 02/14/17   Marylene LandKooistra, Kathryn Lorraine, CNM  ondansetron (ZOFRAN ODT) 4 MG disintegrating tablet Take 1 tablet (4 mg total) by mouth every 8 (eight) hours as needed for nausea or vomiting. 07/19/17   Bethel BornGekas, Kelly Marie, PA-C  oxyCODONE-acetaminophen (PERCOCET/ROXICET) 5-325 MG tablet Take 2 tablets by mouth every 8 (eight) hours as needed for severe pain. 12/23/17   Couture, Cortni S, PA-C    Family History Family History  Problem Relation Age of Onset  . Anesthesia problems Neg Hx   . Hypotension Neg Hx   . Malignant hyperthermia Neg Hx   . Pseudochol deficiency Neg Hx     Social History Social History   Tobacco Use  . Smoking status: Current Every Day Smoker    Packs/day: 1.00    Types: Cigarettes  . Smokeless tobacco: Never Used  Substance Use Topics  . Alcohol use: No  . Drug use: No     Allergies  Bee venom and Penicillins   Review of Systems Review of Systems  Constitutional: Negative for fever.  HENT: Positive for sinus pressure. Negative for sore throat.   Eyes: Negative for photophobia.  Respiratory: Negative for shortness of breath.   Cardiovascular: Negative for chest pain.  Gastrointestinal: Negative for abdominal pain, nausea and vomiting.  Genitourinary: Negative for dysuria and flank pain.  Musculoskeletal: Negative for back pain.  Neurological: Positive for headaches. Negative for light-headedness.     Physical Exam Updated Vital Signs BP 107/63 (BP Location: Right Arm)   Pulse 63   Temp 98.2 F (36.8 C) (Oral)   Resp 16   SpO2 98%   Physical Exam Vitals signs and nursing note reviewed.  Constitutional:      General: She is not in acute distress.    Appearance: She is  well-developed.  HENT:     Head: Normocephalic and atraumatic.     Comments: Tenderness with palpation of the right frontal and maxillary sinus.     Nose:     Right Sinus: Maxillary sinus tenderness and frontal sinus tenderness present.     Left Sinus: No frontal sinus tenderness.     Mouth/Throat:     Pharynx: No oropharyngeal exudate.  Eyes:     Extraocular Movements: Extraocular movements intact.     Conjunctiva/sclera:     Right eye: No exudate or hemorrhage.    Left eye: No exudate or hemorrhage.    Pupils: Pupils are equal, round, and reactive to light.  Neck:     Musculoskeletal: Normal range of motion.  Cardiovascular:     Rate and Rhythm: Regular rhythm.     Heart sounds: Normal heart sounds.  Pulmonary:     Effort: Pulmonary effort is normal. No respiratory distress.     Breath sounds: Normal breath sounds.  Abdominal:     General: Bowel sounds are normal. There is no distension.     Palpations: Abdomen is soft.     Tenderness: There is no abdominal tenderness.  Musculoskeletal:        General: No tenderness or deformity.     Right lower leg: No edema.     Left lower leg: No edema.  Skin:    General: Skin is warm and dry.  Neurological:     Mental Status: She is alert and oriented to person, place, and time.      ED Treatments / Results  Labs (all labs ordered are listed, but only abnormal results are displayed) Labs Reviewed  HCG, SERUM, QUALITATIVE    EKG None  Radiology Ct Head Wo Contrast  Result Date: 03/03/2018 CLINICAL DATA:  Severe headache for 2 weeks. EXAM: CT HEAD WITHOUT CONTRAST TECHNIQUE: Contiguous axial images were obtained from the base of the skull through the vertex without intravenous contrast. COMPARISON:  None. FINDINGS: Brain: No evidence of acute infarction, hemorrhage, hydrocephalus, extra-axial collection, or mass lesion/mass effect. Vascular:  No hyperdense vessel or other acute findings. Skull: No evidence of fracture or other  significant bone abnormality. Sinuses/Orbits:  No acute findings. Other: None. IMPRESSION: Negative noncontrast head CT. Electronically Signed   By: Myles RosenthalJohn  Stahl M.D.   On: 03/03/2018 15:00    Procedures Procedures (including critical care time)  Medications Ordered in ED Medications  sodium chloride 0.9 % bolus 1,000 mL (1,000 mLs Intravenous New Bag/Given 03/03/18 1454)  metoCLOPramide (REGLAN) injection 10 mg (10 mg Intravenous Given 03/03/18 1453)  diphenhydrAMINE (BENADRYL) injection 25 mg (25 mg Intravenous Given 03/03/18 1453)  ketorolac (TORADOL) 15 MG/ML injection 15 mg (15 mg Intravenous Given 03/03/18 1453)     Initial Impression / Assessment and Plan / ED Course  I have reviewed the triage vital signs and the nursing notes.  Pertinent labs & imaging results that were available during my care of the patient were reviewed by me and considered in my medical decision making (see chart for details).    Patient presents to the ED with a chief complaint of tension headache x 2 weeks. She was seen in the ED 2 days ago and was requesting to leave. She reports no nausea, vomiting or photophobia does not have a previous history of migraines. Well appearing. Will try headache cocktail along with CT Head w/o contrast, some suspicion for sinusitis. Low suspicion for acute pathology as patient has been there for 2 weeks and reports no nausea, vomiting or photophobia, dizziness or weakness.   Patient care signed out to Terance Hart PA pending CT Head and assessment.   Final Clinical Impressions(s) / ED Diagnoses   Final diagnoses:  None    ED Discharge Orders    None       Claude Manges, Cordelia Poche 03/03/18 1503    Tilden Fossa, MD 03/03/18 1730

## 2018-03-03 NOTE — ED Triage Notes (Signed)
Headache intermittent x2 weeks, seen here Monday for same.  States she woke up with a headache again.  Arrived by EMS

## 2018-03-10 HISTORY — PX: TOOTH EXTRACTION: SUR596

## 2018-03-10 HISTORY — PX: WISDOM TOOTH EXTRACTION: SHX21

## 2018-05-25 ENCOUNTER — Encounter (HOSPITAL_COMMUNITY): Payer: Self-pay | Admitting: Emergency Medicine

## 2018-05-25 ENCOUNTER — Other Ambulatory Visit: Payer: Self-pay

## 2018-05-25 ENCOUNTER — Emergency Department (HOSPITAL_COMMUNITY)
Admission: EM | Admit: 2018-05-25 | Discharge: 2018-05-25 | Discharge status: Against Medical Advice | Payer: Medicaid Other | Attending: Emergency Medicine | Admitting: Emergency Medicine

## 2018-05-25 DIAGNOSIS — R51 Headache: Secondary | ICD-10-CM | POA: Insufficient documentation

## 2018-05-25 DIAGNOSIS — Z5320 Procedure and treatment not carried out because of patient's decision for unspecified reasons: Secondary | ICD-10-CM | POA: Insufficient documentation

## 2018-05-25 DIAGNOSIS — F17210 Nicotine dependence, cigarettes, uncomplicated: Secondary | ICD-10-CM | POA: Insufficient documentation

## 2018-05-25 DIAGNOSIS — R519 Headache, unspecified: Secondary | ICD-10-CM

## 2018-05-25 MED ORDER — DEXAMETHASONE SODIUM PHOSPHATE 4 MG/ML IJ SOLN
4.0000 mg | Freq: Once | INTRAMUSCULAR | Status: AC
  Administered 2018-05-25: 4 mg via INTRAVENOUS
  Filled 2018-05-25: qty 1

## 2018-05-25 MED ORDER — MAGNESIUM SULFATE IN D5W 1-5 GM/100ML-% IV SOLN
1.0000 g | Freq: Once | INTRAVENOUS | Status: AC
  Administered 2018-05-25: 1 g via INTRAVENOUS
  Filled 2018-05-25: qty 100

## 2018-05-25 MED ORDER — METOCLOPRAMIDE HCL 5 MG/ML IJ SOLN
10.0000 mg | Freq: Once | INTRAMUSCULAR | Status: AC
  Administered 2018-05-25: 10 mg via INTRAVENOUS
  Filled 2018-05-25: qty 2

## 2018-05-25 NOTE — ED Triage Notes (Signed)
Pt states she has had a headache for 5 days-right side of head/ear. Denies n/v. Denies vision changes.

## 2018-05-25 NOTE — ED Notes (Signed)
Pt removed her IV, states she needs to leave now to check on her nephew. RN advised against leaving. Pt states she accepts risks and must leave. MD notified. RN placed gauze over site where IV was.

## 2018-05-25 NOTE — ED Provider Notes (Signed)
[facility]   CSN: [id number] Arrival date & time: 05/25/18  [id number]    History   Chief Complaint Chief Complaint  Patient presents with  . Headache    HPI Misty Ewing is a 28 y.o. female.     HPI   Patient presents for evaluation of headache, previously seen for same and had imaging, December 2019.  At that time CT of the head was negative.  She states that she is using acetaminophen with caffeine, without relief.  She has pain in the right side of her head, and her right ear.  She denies trauma.  She denies loss of hearing, cough, sore throat, fever, chills or nasal congestion.  She works at a [employer].  She denies known trauma.  She drove her vehicle here to be evaluated.  States she previously had a headache and was evaluated here for the same, and they "gave me a cocktail, and it made me hallucinate."  There are no other known modifying factors.  Past Medical History:  Diagnosis Date  . Depression     Patient Active Problem List   Diagnosis Date Noted  . Trichomoniasis 02/14/2017  . Initiation of Depo Provera 02/12/2017  . Pregnancy examination or test, negative result 02/12/2017  . Annual physical exam 02/12/2017    Past Surgical History:  Procedure Laterality Date  . NO PAST SURGERIES       OB History    Gravida  4   Para  4   Term  3   Preterm  1   AB  0   Living  4     SAB  0   TAB  0   Ectopic  0   Multiple  0   Live Births  1            Home Medications    Prior to Admission medications   Medication Sig Start Date End Date Taking? Authorizing Provider  aspirin-acetaminophen-caffeine (EXCEDRIN MIGRAINE) [phone] [id number] MG tablet Take 2 tablets by mouth every 6 (six) hours as needed for headache.    [provider]  hydrocortisone-pramoxine (PROCTOFOAM HC) rectal foam Place 1 applicator rectally 2 (two) times daily. 12/23/17   Couture, Cortni S, PA-C  ibuprofen  (ADVIL,MOTRIN) 800 MG tablet Take 1 tablet (800 mg total) by mouth 3 (three) times daily. 02/08/17   Georgiana ShoreMitchell, Jessica B, PA-C  metroNIDAZOLE (FLAGYL) 500 MG tablet Take 1 tablet (500 mg total) by mouth 2 (two) times daily. 02/14/17   Marylene LandKooistra, Kathryn Lorraine, CNM  ondansetron (ZOFRAN ODT) 4 MG disintegrating tablet Take 1 tablet (4 mg total) by mouth every 8 (eight) hours as needed for nausea or vomiting. 07/19/17   Bethel BornGekas, Kelly Marie, PA-C  oxyCODONE-acetaminophen (PERCOCET/ROXICET) 5-325 MG tablet Take 2 tablets by mouth every 8 (eight) hours as needed for severe pain. 12/23/17   Couture, Cortni S, PA-C    Family History Family History  Problem Relation Age of Onset  . Anesthesia problems Neg Hx   . Hypotension Neg Hx   . Malignant hyperthermia Neg Hx   . Pseudochol deficiency Neg Hx     Social History Social History   Tobacco Use  . Smoking status: Current Every Day Smoker    Packs/day: 1.00    Types: Cigarettes  . Smokeless tobacco: Never Used  Substance Use Topics  . Alcohol use: Yes  . Drug use: No     Allergies   Bee venom and Penicillins  Review of Systems Review of Systems  All other systems reviewed and are negative.    Physical Exam Updated Vital Signs BP 129/72 (BP Location: Right Arm)   Pulse 83   Temp 98 F (36.7 C) (Oral)   Resp 18   Ht 5\' 1"  (1.549 m)   Wt 63.5 kg   LMP 05/21/2018   SpO2 100%   BMI 26.45 kg/m   Physical Exam Vitals signs and nursing note reviewed.  Constitutional:      General: She is not in acute distress.    Appearance: She is well-developed. She is not ill-appearing, toxic-appearing or diaphoretic.  HENT:     Head: Normocephalic and atraumatic.     Right Ear: Tympanic membrane, ear canal and external ear normal.     Left Ear: Tympanic membrane, ear canal and external ear normal.     Nose: No congestion.     Mouth/Throat:     Mouth: Mucous membranes are moist.     Pharynx: No oropharyngeal exudate or posterior  oropharyngeal erythema.  Eyes:     Conjunctiva/sclera: Conjunctivae normal.     Pupils: Pupils are equal, round, and reactive to light.  Neck:     Musculoskeletal: Normal range of motion and neck supple. Muscular tenderness (Mild tenderness with tight muscles palpated, right posterior auricular and right lateral neck.) present. No neck rigidity.     Trachea: Phonation normal.  Cardiovascular:     Rate and Rhythm: Normal rate and regular rhythm.     Heart sounds: Normal heart sounds.  Pulmonary:     Effort: Pulmonary effort is normal.     Breath sounds: Normal breath sounds.  Abdominal:     General: There is no distension.     Palpations: Abdomen is soft.     Tenderness: There is no abdominal tenderness.  Musculoskeletal: Normal range of motion.  Skin:    General: Skin is warm and dry.  Neurological:     Mental Status: She is alert and oriented to person, place, and time.     Cranial Nerves: No cranial nerve deficit.     Sensory: No sensory deficit.     Motor: No abnormal muscle tone.     Coordination: Coordination normal.     Comments: No dysarthria, aphasia or nystagmus.  Psychiatric:        Mood and Affect: Mood normal.        Behavior: Behavior normal.        Thought Content: Thought content normal.        Judgment: Judgment normal.      ED Treatments / Results  Labs (all labs ordered are listed, but only abnormal results are displayed) Labs Reviewed - No data to display  EKG None  Radiology No results found.  Procedures Procedures (including critical care time)  Medications Ordered in ED Medications  metoCLOPramide (REGLAN) injection 10 mg (10 mg Intravenous Given 05/25/18 0944)  magnesium sulfate IVPB 1 g 100 mL (0 g Intravenous Stopped 05/25/18 1002)  dexamethasone (DECADRON) injection 4 mg (4 mg Intravenous Given 05/25/18 0944)     Initial Impression / Assessment and Plan / ED Course  I have reviewed the triage vital signs and the nursing notes.   Pertinent labs & imaging results that were available during my care of the patient were reviewed by me and considered in my medical decision making (see chart for details).  Clinical Course as of May 24 1008  Tue May 25, 2018  0920 Narcotic database reviewed   [  EW]    Clinical Course User Index [EW] Mancel Bale, MD        Patient Vitals for the past 24 hrs:  BP Temp Temp src Pulse Resp SpO2 Height Weight  05/25/18 0917 129/72 98 F (36.7 C) Oral 83 18 100 % 5\' 1"  (1.549 m) 63.5 kg  05/25/18 0916 129/72 98 F (36.7 C) Oral 84 18 98 % - -  05/25/18 0915 129/72 - - - - - - -    10:09 AM Reevaluation with update and discussion. After initial assessment and treatment, an updated evaluation reveals the patient removed her IV and told her she had to leave because of a family issue.  I saw her walking out, without any difficulty. Mancel Bale   Medical Decision Making: Nonspecific recurrent headache, with likely muscle tension.  Patient chose to leave prior to completion of treatment.  CRITICAL CARE-no Performed by: Mancel Bale  Nursing Notes Reviewed/ Care Coordinated Applicable Imaging Reviewed Interpretation of Laboratory Data incorporated into ED treatment   Disposition-left AGAINST MEDICAL ADVICE  Final Clinical Impressions(s) / ED Diagnoses   Final diagnoses:  None    ED Discharge Orders    None       Mancel Bale, MD 05/25/18 1011

## 2018-11-27 ENCOUNTER — Emergency Department (HOSPITAL_COMMUNITY): Payer: Medicaid Other

## 2018-11-27 ENCOUNTER — Emergency Department (HOSPITAL_COMMUNITY)
Admission: EM | Admit: 2018-11-27 | Discharge: 2018-11-27 | Disposition: A | Discharge status: Home / Self Care | Payer: Medicaid Other | Attending: Emergency Medicine | Admitting: Emergency Medicine

## 2018-11-27 ENCOUNTER — Encounter (HOSPITAL_COMMUNITY): Payer: Self-pay

## 2018-11-27 ENCOUNTER — Other Ambulatory Visit: Payer: Self-pay

## 2018-11-27 DIAGNOSIS — F17210 Nicotine dependence, cigarettes, uncomplicated: Secondary | ICD-10-CM | POA: Insufficient documentation

## 2018-11-27 DIAGNOSIS — I309 Acute pericarditis, unspecified: Secondary | ICD-10-CM | POA: Insufficient documentation

## 2018-11-27 LAB — I-STAT BETA HCG BLOOD, ED (MC, WL, AP ONLY): I-stat hCG, quantitative: <5 m[IU]/mL (ref <5)

## 2018-11-27 LAB — CBC
HCT: 42.7 % (ref 36.0–46.0)
Hemoglobin: 13.9 g/dL (ref 12.0–15.0)
MCH: 31 pg (ref 26.0–34.0)
MCHC: 32.6 g/dL (ref 30.0–36.0)
MCV: 95.1 fL (ref 80.0–100.0)
Platelets: 291 10*3/uL (ref 150–400)
RBC: 4.49 MIL/uL (ref 3.87–5.11)
RDW: 13.1 % (ref 11.5–15.5)
WBC: 6.2 10*3/uL (ref 4.0–10.5)
nRBC: 0 % (ref 0.0–0.2)

## 2018-11-27 LAB — BASIC METABOLIC PANEL
Anion gap: 7 (ref 5–15)
BUN: 11 mg/dL (ref 6–20)
CO2: 22 mmol/L (ref 22–32)
Calcium: 8.9 mg/dL (ref 8.9–10.3)
Chloride: 107 mmol/L (ref 98–111)
Creatinine, Ser: 0.81 mg/dL (ref 0.44–1.00)
GFR calc Af Amer: >60 mL/min (ref >60)
GFR calc non Af Amer: >60 mL/min (ref >60)
Glucose, Bld: 80 mg/dL (ref 70–99)
Potassium: 3.9 mmol/L (ref 3.5–5.1)
Sodium: 136 mmol/L (ref 135–145)

## 2018-11-27 LAB — TROPONIN I (HIGH SENSITIVITY): Troponin I (High Sensitivity): 7 ng/L (ref <18)

## 2018-11-27 MED ORDER — EPINEPHRINE 0.3 MG/0.3ML IJ SOAJ: 0.3000 mg | INTRAMUSCULAR | 1 refills | Status: DC | PRN

## 2018-11-27 MED ORDER — IBUPROFEN 800 MG PO TABS
800.0000 mg | ORAL_TABLET | Freq: Once | ORAL | Status: AC
  Administered 2018-11-27: 800 mg via ORAL
  Filled 2018-11-27: qty 1

## 2018-11-27 MED ORDER — IBUPROFEN 800 MG PO TABS: 800.0000 mg | ORAL_TABLET | Freq: Three times a day (TID) | ORAL | 0 refills | Status: AC

## 2018-11-27 NOTE — ED Triage Notes (Signed)
Pt endorses CP that woke her up 3 hours ago. Also has had nausea x 3 days. VSS.

## 2018-11-27 NOTE — ED Provider Notes (Signed)
[facility] EMERGENCY DEPARTMENT Provider Note   CSN: [id number] Arrival date & time: 11/27/18  1549     History   Chief Complaint Chief Complaint  Patient presents with  . Chest Pain    HPI [name] is a 27 y.o. female who presents emergency department with chest pain.  Patient reports about 1 PM today chest pain woke her up from sleep and has been constant since then.  Patient reports that stabbing needlelike feeling midsternally and does not radiate.  Patient does not have any associated shortness of breath or diaphoresis.  Patient notices the pain is worse when she lays flat and improves with leaning forward.  Patient denies any cough or fever or recent viral illness.  Patient denies any pain or swelling in her legs or history of a blood clot.  Patient reports the pain is not pleuritic.  Patient reports she has had some nausea the last 2 days but no vomiting.  HPI: A 28 year old patient presents for evaluation of chest pain. Initial onset of pain was approximately 3-6 hours ago. The patient's chest pain is sharp and is not worse with exertion. The patient complains of nausea. The patient's chest pain is middle- or left-sided, is not well-localized, is not described as heaviness/pressure/tightness and does not radiate to the arms/jaw/neck. The patient denies diaphoresis. The patient has no history of stroke, has no history of peripheral artery disease, has not smoked in the past 90 days, denies any history of treated diabetes, has no relevant family history of coronary artery disease (first degree relative at less than age 62), is not hypertensive, has no history of hypercholesterolemia and does not have an elevated BMI (>=30).   The history is provided by the patient.    Past Medical History:  Diagnosis Date  . Depression     Patient Active Problem List   Diagnosis Date Noted  . Trichomoniasis 02/14/2017  . Initiation of Depo Provera 02/12/2017  .  Pregnancy examination or test, negative result 02/12/2017  . Annual physical exam 02/12/2017    Past Surgical History:  Procedure Laterality Date  . NO PAST SURGERIES       OB History    Gravida  4   Para  4   Term  3   Preterm  1   AB  0   Living  4     SAB  0   TAB  0   Ectopic  0   Multiple  0   Live Births  1            Home Medications    Prior to Admission medications   Medication Sig Start Date End Date Taking? Authorizing Provider  EPINEPHrine 0.3 mg/0.3 mL IJ SOAJ injection Inject 0.3 mLs (0.3 mg total) into the muscle as needed for anaphylaxis. 11/27/18   Zoanne Newill, Missy Sabins, MD  hydrocortisone-pramoxine (PROCTOFOAM [facility]) rectal foam Place 1 applicator rectally 2 (two) times daily. Patient not taking: Reported on 11/27/2018 12/23/17   Couture, Cortni S, PA-C  ibuprofen (ADVIL) 800 MG tablet Take 1 tablet (800 mg total) by mouth 3 (three) times daily. 11/27/18 12/27/18  Betsey Amen, MD  ondansetron (ZOFRAN ODT) 4 MG disintegrating tablet Take 1 tablet (4 mg total) by mouth every 8 (eight) hours as needed for nausea or vomiting. Patient not taking: Reported on 11/27/2018 07/19/17   Recardo Evangelist, PA-C  oxyCODONE-acetaminophen (PERCOCET/ROXICET) 5-325 MG tablet Take 2 tablets by mouth every 8 (eight) hours  as needed for severe pain. Patient not taking: Reported on 11/27/2018 12/23/17   Couture, Cortni S, PA-C    Family History Family History  Problem Relation Age of Onset  . Anesthesia problems Neg Hx   . Hypotension Neg Hx   . Malignant hyperthermia Neg Hx   . Pseudochol deficiency Neg Hx     Social History Social History   Tobacco Use  . Smoking status: Current Every Day Smoker    Packs/day: 1.00    Types: Cigarettes  . Smokeless tobacco: Never Used  Substance Use Topics  . Alcohol use: Yes  . Drug use: No     Allergies   Bee venom and Penicillins   Review of Systems Review of Systems  Constitutional: Negative for  fever.  HENT: Negative for congestion and trouble swallowing.   Eyes: Negative for visual disturbance.  Respiratory: Negative for cough and shortness of breath.   Cardiovascular: Positive for chest pain.  Gastrointestinal: Positive for nausea. Negative for abdominal pain, constipation, diarrhea and vomiting.  Genitourinary: Negative for dysuria and vaginal discharge.  Musculoskeletal: Negative for back pain and myalgias.  Skin: Negative for rash.  Neurological: Negative for tremors, light-headedness and headaches.  Psychiatric/Behavioral: Negative for confusion.     Physical Exam Updated Vital Signs BP 116/80   Pulse 87   Temp 98 F (36.7 C) (Oral)   Resp (!) 26   LMP 11/24/2018   SpO2 99%   Physical Exam Constitutional:      General: She is not in acute distress.    Appearance: She is not diaphoretic.  HENT:     Head: Normocephalic and atraumatic.     Right Ear: External ear normal.     Left Ear: External ear normal.     Nose: Nose normal.     Mouth/Throat:     Mouth: Mucous membranes are moist.     Pharynx: Oropharynx is clear.  Eyes:     Conjunctiva/sclera: Conjunctivae normal.     Pupils: Pupils are equal, round, and reactive to light.  Neck:     Musculoskeletal: Neck supple.  Cardiovascular:     Rate and Rhythm: Normal rate and regular rhythm.     Pulses: Normal pulses.     Heart sounds: Normal heart sounds.  Pulmonary:     Effort: Pulmonary effort is normal. No respiratory distress.     Breath sounds: Normal breath sounds. No wheezing, rhonchi or rales.  Chest:     Chest wall: No tenderness.  Abdominal:     Palpations: Abdomen is soft.     Tenderness: There is no abdominal tenderness. There is no guarding or rebound.  Musculoskeletal:     Right lower leg: No edema.     Left lower leg: No edema.  Skin:    General: Skin is warm and dry.  Neurological:     General: No focal deficit present.     Mental Status: She is alert.     Cranial Nerves: Cranial  nerves are intact.     Sensory: Sensation is intact.     Motor: Motor function is intact.     Gait: Gait is intact.      ED Treatments / Results  Labs (all labs ordered are listed, but only abnormal results are displayed) Labs Reviewed  BASIC METABOLIC PANEL  CBC  I-STAT BETA HCG BLOOD, ED ([facility], [facility], [facility] ONLY)  TROPONIN I (HIGH SENSITIVITY)    EKG EKG Interpretation  Date/Time:  Saturday November 27 2018 15:53:07 EDT  Ventricular Rate:  91 PR Interval:  132 QRS Duration: 76 QT Interval:  332 QTC Calculation: 408 R Axis:   91 Text Interpretation:  Normal sinus rhythm Borderline ECG Confirmed by Blane Ohara [phone]) on 11/27/2018 5:22:26 PM   Radiology Dg Chest 2 View  Result Date: 11/27/2018 CLINICAL DATA:  Central chest pain EXAM: CHEST - 2 VIEW COMPARISON:  02/08/2017 FINDINGS: The heart size and mediastinal contours are within normal limits. Both lungs are clear. The visualized skeletal structures are unremarkable. IMPRESSION: No acute abnormality of the lungs. Electronically Signed   By: Lauralyn Primes M.D.   On: 11/27/2018 16:47    Procedures Ultrasound ED Echo  Date/Time: 11/27/2018 11:38 PM Performed by: Ignacia Palma, MD Authorized by: Blane Ohara, MD   Procedure details:    Indications: chest pain     Views: subxiphoid, parasternal long axis view, parasternal short axis view and apical 4 chamber view     Images: archived   Findings:    Pericardium: no pericardial effusion     LV Function: normal (>50% EF)     RV Diameter: normal   Impression:    Impression: normal     (including critical care time)  Medications Ordered in ED Medications  ibuprofen (ADVIL) tablet 800 mg (800 mg Oral Given 11/27/18 1730)     Initial Impression / Assessment and Plan / ED Course  I have reviewed the triage vital signs and the nursing notes.  Pertinent labs & imaging results that were available during my care of the patient were reviewed by me and considered in  my medical decision making (see chart for details).    Based on patient's history and the positional nature of her chest pain concern for pericarditis.  Low concern for ACS given patient's age, history, and risk factors.  Low suspicion for PE HEAR Score: 0, PERC negative.  Point-of-care ultrasound performed which showed normal ejection fraction and no pericardial effusion.  Patient given ibuprofen for pain with some improvement in her symptoms.  Initial troponin negative and patient has been having constant chest pain for more than 6 hours, repeat troponin not clinically indicated.  Remainder of laboratory studies unrevealing.  Advised ibuprofen 3 times daily for symptoms to treat pericarditis and provided with phone number to establish with primary care doctor for close follow-up.  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  All questions answered and strict precautions given.  Patient comfortable with plan to discharge home, continue ibuprofen, and establish with a primary care doctor for close follow-up.   Patient seen and plan discussed with Dr. Jodi Mourning.   Final Clinical Impressions(s) / ED Diagnoses   Final diagnoses:  Acute pericarditis, unspecified type    ED Discharge Orders         Ordered    ibuprofen (ADVIL) 800 MG tablet  3 times daily     11/27/18 1825    EPINEPHrine 0.3 mg/0.3 mL IJ SOAJ injection  As needed     11/27/18 1832           Mikaiah Stoffer, Philmore Pali, MD 11/28/18 9030    Blane Ohara, MD 11/28/18 [phone]

## 2018-11-27 NOTE — ED Notes (Signed)
Patient verbalizes understanding of discharge instructions. Opportunity for questioning and answers were provided. Armband removed by staff, pt discharged from ED ambulatory.   

## 2018-12-09 ENCOUNTER — Emergency Department (HOSPITAL_COMMUNITY)
Admission: EM | Admit: 2018-12-09 | Discharge: 2018-12-09 | Discharge status: Against Medical Advice | Payer: Medicaid Other | Attending: Emergency Medicine | Admitting: Emergency Medicine

## 2018-12-09 DIAGNOSIS — Z5321 Procedure and treatment not carried out due to patient leaving prior to being seen by health care provider: Secondary | ICD-10-CM | POA: Diagnosis not present

## 2018-12-09 DIAGNOSIS — K0889 Other specified disorders of teeth and supporting structures: Secondary | ICD-10-CM | POA: Diagnosis present

## 2018-12-09 NOTE — ED Notes (Signed)
PT reports "she is about to go! Her mouth is hurting too bad." RN explained that her mouth will not feel any better if she goes. RN advised her to continue to wait bc will not be long time. She is sobbing and returned to room.

## 2018-12-09 NOTE — ED Triage Notes (Signed)
Pt reports 1 week of right upper mouth pain. Unable to eat or drink.

## 2018-12-09 NOTE — ED Notes (Signed)
PT left when registration went into the room. RN left PAs know.

## 2020-04-20 ENCOUNTER — Emergency Department (HOSPITAL_COMMUNITY)
Admission: EM | Admit: 2020-04-20 | Discharge: 2020-04-20 | Disposition: A | Discharge status: Home / Self Care | Payer: Medicaid Other | Attending: Emergency Medicine | Admitting: Emergency Medicine

## 2020-04-20 ENCOUNTER — Other Ambulatory Visit: Payer: Self-pay

## 2020-04-20 ENCOUNTER — Encounter (HOSPITAL_COMMUNITY): Payer: Self-pay | Admitting: Emergency Medicine

## 2020-04-20 DIAGNOSIS — Z5321 Procedure and treatment not carried out due to patient leaving prior to being seen by health care provider: Secondary | ICD-10-CM | POA: Diagnosis not present

## 2020-04-20 DIAGNOSIS — R519 Headache, unspecified: Secondary | ICD-10-CM | POA: Insufficient documentation

## 2020-04-20 NOTE — ED Notes (Signed)
Pt left without being seen.

## 2020-04-20 NOTE — ED Notes (Signed)
Pt didn't answer when called for vitals  °

## 2020-04-20 NOTE — ED Notes (Signed)
Pt did not respond when called for vitals check X3  

## 2020-04-20 NOTE — ED Notes (Signed)
Pt did not respond when called for vitals check 

## 2020-04-20 NOTE — ED Triage Notes (Signed)
Patient here with complaint of headache for 10 days. State she has tried tylenol without relief. No neurological deficits.

## 2020-10-15 ENCOUNTER — Inpatient Hospital Stay (HOSPITAL_COMMUNITY): Payer: Medicaid Other

## 2020-10-15 ENCOUNTER — Inpatient Hospital Stay (HOSPITAL_COMMUNITY)
Admission: AD | Admit: 2020-10-15 | Discharge: 2020-10-15 | Disposition: A | Discharge status: Home / Self Care | Payer: Medicaid Other | Attending: Obstetrics & Gynecology | Admitting: Obstetrics & Gynecology

## 2020-10-15 ENCOUNTER — Other Ambulatory Visit: Payer: Self-pay

## 2020-10-15 ENCOUNTER — Encounter (HOSPITAL_COMMUNITY): Payer: Self-pay | Admitting: Obstetrics & Gynecology

## 2020-10-15 DIAGNOSIS — F17210 Nicotine dependence, cigarettes, uncomplicated: Secondary | ICD-10-CM | POA: Diagnosis not present

## 2020-10-15 DIAGNOSIS — R1030 Lower abdominal pain, unspecified: Secondary | ICD-10-CM | POA: Diagnosis present

## 2020-10-15 DIAGNOSIS — Z3A09 9 weeks gestation of pregnancy: Secondary | ICD-10-CM | POA: Diagnosis not present

## 2020-10-15 DIAGNOSIS — O26891 Other specified pregnancy related conditions, first trimester: Secondary | ICD-10-CM | POA: Diagnosis not present

## 2020-10-15 DIAGNOSIS — O2341 Unspecified infection of urinary tract in pregnancy, first trimester: Secondary | ICD-10-CM | POA: Insufficient documentation

## 2020-10-15 DIAGNOSIS — Z880 Allergy status to penicillin: Secondary | ICD-10-CM | POA: Insufficient documentation

## 2020-10-15 DIAGNOSIS — O99331 Smoking (tobacco) complicating pregnancy, first trimester: Secondary | ICD-10-CM | POA: Insufficient documentation

## 2020-10-15 DIAGNOSIS — O26899 Other specified pregnancy related conditions, unspecified trimester: Secondary | ICD-10-CM

## 2020-10-15 DIAGNOSIS — Z3491 Encounter for supervision of normal pregnancy, unspecified, first trimester: Secondary | ICD-10-CM

## 2020-10-15 LAB — URINALYSIS, ROUTINE W REFLEX MICROSCOPIC
Bilirubin Urine: NEGATIVE
Glucose, UA: NEGATIVE mg/dL
Hgb urine dipstick: NEGATIVE
Ketones, ur: NEGATIVE mg/dL
Leukocytes,Ua: NEGATIVE
Nitrite: POSITIVE — AB
Protein, ur: NEGATIVE mg/dL
Specific Gravity, Urine: 1.017 (ref 1.005–1.030)
pH: 8 (ref 5.0–8.0)

## 2020-10-15 LAB — CBC
HCT: 40.3 % (ref 36.0–46.0)
Hemoglobin: 13.9 g/dL (ref 12.0–15.0)
MCH: 31.4 pg (ref 26.0–34.0)
MCHC: 34.5 g/dL (ref 30.0–36.0)
MCV: 91.2 fL (ref 80.0–100.0)
Platelets: 252 10*3/uL (ref 150–400)
RBC: 4.42 MIL/uL (ref 3.87–5.11)
RDW: 12.9 % (ref 11.5–15.5)
WBC: 6.3 10*3/uL (ref 4.0–10.5)
nRBC: 0 % (ref 0.0–0.2)

## 2020-10-15 LAB — WET PREP, GENITAL
Sperm: NONE SEEN
Trich, Wet Prep: NONE SEEN
Yeast Wet Prep HPF POC: NONE SEEN

## 2020-10-15 LAB — POCT PREGNANCY, URINE: Preg Test, Ur: POSITIVE — AB

## 2020-10-15 LAB — HCG, QUANTITATIVE, PREGNANCY: hCG, Beta Chain, Quant, S: 102205 m[IU]/mL — ABNORMAL HIGH (ref <5)

## 2020-10-15 MED ORDER — FOSFOMYCIN TROMETHAMINE 3 G PO PACK: 3.0000 g | PACK | Freq: Once | ORAL | 0 refills | Status: AC

## 2020-10-15 NOTE — Discharge Instructions (Signed)
Safe Medications in Pregnancy    Acne: Benzoyl Peroxide Salicylic Acid  Backache/Headache: Tylenol: 2 regular strength every 4 hours OR              2 Extra strength every 6 hours  Colds/Coughs/Allergies: Benadryl (alcohol free) 25 mg every 6 hours as needed Breath right strips Claritin Cepacol throat lozenges Chloraseptic throat spray Cold-Eeze- up to three times per day Cough drops, alcohol free Flonase (by prescription only) Guaifenesin Mucinex Robitussin DM (plain only, alcohol free) Saline nasal spray/drops Sudafed (pseudoephedrine) & Actifed ** use only after [redacted] weeks gestation and if you do not have high blood pressure Tylenol Vicks Vaporub Zinc lozenges Zyrtec   Constipation: Colace Ducolax suppositories Fleet enema Glycerin suppositories Metamucil Milk of magnesia Miralax Senokot Smooth move tea  Diarrhea: Kaopectate Imodium A-D  *NO pepto Bismol  Hemorrhoids: Anusol Anusol HC Preparation H Tucks  Indigestion: Tums Maalox Mylanta Zantac  Pepcid  Insomnia: Benadryl (alcohol free) 25mg every 6 hours as needed Tylenol PM Unisom, no Gelcaps  Leg Cramps: Tums MagGel  Nausea/Vomiting:  Bonine Dramamine Emetrol Ginger extract Sea bands Meclizine  Nausea medication to take during pregnancy:  Unisom (doxylamine succinate 25 mg tablets) Take one tablet daily at bedtime. If symptoms are not adequately controlled, the dose can be increased to a maximum recommended dose of two tablets daily (1/2 tablet in the morning, 1/2 tablet mid-afternoon and one at bedtime). Vitamin B6 100mg tablets. Take one tablet twice a day (up to 200 mg per day).  Skin Rashes: Aveeno products Benadryl cream or 25mg every 6 hours as needed Calamine Lotion 1% cortisone cream  Yeast infection: Gyne-lotrimin 7 Monistat 7   **If taking multiple medications, please check labels to avoid duplicating the same active ingredients **take  medication as directed on the label ** Do not exceed 4000 mg of tylenol in 24 hours **Do not take medications that contain aspirin or ibuprofen   [facility] Providers   [facility]             [address], [place], [place] [address] [phone]  [facility]                                                             [address], [address], [place], [place], [address] [phone]  [facility]                                    [address], [address], [place], [place], [address] [phone]  [facility] [place] [address], [place], [place], [address] [phone]  [facility]                                 [address], [place], [place], [address] [phone]  [facility]                                      [address], [place], [place], [address] [phone]  [facility] [address], [address], [place], [place], [address]                              [facility] [address], [place], [place], [address] [phone]  [facility]         Phone: [phone]  [facility]      Phone: [phone]   [facility]      Phone: [phone]  [facility]         Phone: [phone]   [facility]    Phone: [phone]  [facility]      Phone: [phone]  [facility]      Phone: [phone]  [facility]        Phone: [phone]  [facility]      Phone: [phone]  

## 2020-10-15 NOTE — MAU Note (Signed)
Needs to have proof of pregnancy for social services. Stated she has been having lower abd pain for the past 3 days. Denies any vag bleeding or discharge.

## 2020-10-15 NOTE — MAU Provider Note (Signed)
Chief Complaint:  Possible Pregnancy and Abdominal Pain   Event Date/Time   First Provider Initiated Contact with Patient 10/15/20 1232     HPI: Misty Ewing is a 30 y.o. [id number] at [redacted]w[redacted]d who presents to maternity admissions reporting positive pregnancy test and lower abdominal cramping. Patient reports that she has had lower abdominal cramping for the past 3 days, but pain is only at night. No pain currently. Think it's "because I hold my pee". She denies vaginal bleeding, discharge, itching, odor or urinary s/s. Unsure of LMP.    Pregnancy Course:   Past Medical History:  Diagnosis Date   Depression    OB History  Gravida Para Term Preterm AB Living  5 4 3 1  0 4  SAB IAB Ectopic Multiple Live Births  0 0 0 0 1    # Outcome Date GA Lbr Len/2nd Weight Sex Delivery Anes PTL Lv  5 Current           4 Preterm 03/03/12 [redacted]w[redacted]d 616:10 / 00:09 2770 g F Vag-Spont EPI  LIV     Birth Comments: WNL  3 Term           2 Term           1 Term            Past Surgical History:  Procedure Laterality Date   NO PAST SURGERIES     Family History  Problem Relation Age of Onset   Anesthesia problems Neg Hx    Hypotension Neg Hx    Malignant hyperthermia Neg Hx    Pseudochol deficiency Neg Hx    Social History   Tobacco Use   Smoking status: Every Day    Packs/day: 1.00    Types: Cigarettes   Smokeless tobacco: Never  Substance Use Topics   Alcohol use: Yes   Drug use: No   Allergies  Allergen Reactions   Bee Venom Swelling    Unknown type of swelling   Penicillins Swelling    Has patient had a PCN reaction causing immediate rash, facial/tongue/throat swelling, SOB or lightheadedness with hypotension: No Has patient had a PCN reaction causing severe rash involving mucus membranes or skin necrosis: No Has patient had a PCN reaction that required hospitalization No Has patient had a PCN reaction occurring within the last 10 years: No (childhood reaction) If all of the above  answers are "NO", then may proceed with Cephalosporin use.   Medications Prior to Admission  Medication Sig Dispense Refill Last Dose   EPINEPHrine 0.3 mg/0.3 mL IJ SOAJ injection Inject 0.3 mLs (0.3 mg total) into the muscle as needed for anaphylaxis. 2 each 1    hydrocortisone-pramoxine (PROCTOFOAM HC) rectal foam Place 1 applicator rectally 2 (two) times daily. (Patient not taking: Reported on 11/27/2018) 10 g 0    ondansetron (ZOFRAN ODT) 4 MG disintegrating tablet Take 1 tablet (4 mg total) by mouth every 8 (eight) hours as needed for nausea or vomiting. (Patient not taking: Reported on 11/27/2018) 8 tablet 0    oxyCODONE-acetaminophen (PERCOCET/ROXICET) 5-325 MG tablet Take 2 tablets by mouth every 8 (eight) hours as needed for severe pain. (Patient not taking: Reported on 11/27/2018) 6 tablet 0     I have reviewed patient's Past Medical Hx, Surgical Hx, Family Hx, Social Hx, medications and allergies.   ROS:  Review of Systems  Constitutional: Negative.   Respiratory: Negative.    Cardiovascular: Negative.   Gastrointestinal:  Positive for abdominal pain (cramping).  Genitourinary: Negative.   Musculoskeletal: Negative.   Neurological: Negative.    Physical Exam  Patient Vitals for the past 24 hrs:  BP Temp Pulse Resp Height Weight  10/15/20 1202 (!) 102/55 98.5 F (36.9 C) 74 18 5' 1.5" (1.562 m) 56.2 kg   Constitutional: well-developed, well-nourished female in no acute distress.  Cardiovascular: normal rate Respiratory: normal effort GI: abd soft, non-tender MS: extremities nontender, no edema, normal ROM Neurologic: alert and oriented x 4.  GU: neg CVAT. Pelvic: deferred, blind swabs obtained   Labs: Results for orders placed or performed during the hospital encounter of 10/15/20 (from the past 24 hour(s))  Pregnancy, urine POC     Status: Abnormal   Collection Time: 10/15/20 12:12 PM  Result Value Ref Range   Preg Test, Ur POSITIVE (A) NEGATIVE  Wet prep, genital      Status: Abnormal   Collection Time: 10/15/20 12:22 PM  Result Value Ref Range   Yeast Wet Prep HPF POC NONE SEEN NONE SEEN   Trich, Wet Prep NONE SEEN NONE SEEN   Clue Cells Wet Prep HPF POC PRESENT (A) NONE SEEN   WBC, Wet Prep HPF POC MODERATE (A) NONE SEEN   Sperm NONE SEEN   Urinalysis, Routine w reflex microscopic Urine, Clean Catch     Status: Abnormal   Collection Time: 10/15/20 12:25 PM  Result Value Ref Range   Color, Urine AMBER (A) YELLOW   APPearance CLOUDY (A) CLEAR   Specific Gravity, Urine 1.017 1.005 - 1.030   pH 8.0 5.0 - 8.0   Glucose, UA NEGATIVE NEGATIVE mg/dL   Hgb urine dipstick NEGATIVE NEGATIVE   Bilirubin Urine NEGATIVE NEGATIVE   Ketones, ur NEGATIVE NEGATIVE mg/dL   Protein, ur NEGATIVE NEGATIVE mg/dL   Nitrite POSITIVE (A) NEGATIVE   Leukocytes,Ua NEGATIVE NEGATIVE   RBC / HPF 0-5 0 - 5 RBC/hpf   WBC, UA 6-10 0 - 5 WBC/hpf   Bacteria, UA MANY (A) NONE SEEN   Squamous Epithelial / LPF 11-20 0 - 5   Mucus PRESENT    Amorphous Crystal PRESENT   hCG, quantitative, pregnancy     Status: Abnormal   Collection Time: 10/15/20 12:38 PM  Result Value Ref Range   hCG, Beta Chain, Quant, S 102,205 (H) <5 mIU/mL  CBC     Status: None   Collection Time: 10/15/20 12:38 PM  Result Value Ref Range   WBC 6.3 4.0 - 10.5 K/uL   RBC 4.42 3.87 - 5.11 MIL/uL   Hemoglobin 13.9 12.0 - 15.0 g/dL   HCT 25.4 27.0 - 62.3 %   MCV 91.2 80.0 - 100.0 fL   MCH 31.4 26.0 - 34.0 pg   MCHC 34.5 30.0 - 36.0 g/dL   RDW 76.2 83.1 - 51.7 %   Platelets 252 150 - 400 K/uL   nRBC 0.0 0.0 - 0.2 %    Imaging:  US OB Comp Less 14 Wks  Result Date: 10/15/2020 CLINICAL DATA:  Abdominal pain during pregnancy EXAM: OBSTETRIC <14 WK ULTRASOUND TECHNIQUE: Transabdominal ultrasound was performed for evaluation of the gestation as well as the maternal uterus and adnexal regions. COMPARISON:  11/22/2013 FINDINGS: Intrauterine gestational sac: Single Yolk sac:  Visualized. Embryo:  Visualized.  Cardiac Activity: Visualized. Heart Rate: 178 bpm CRL:   23.6 mm   9 w 0 d                  [place] EDC: 05/20/2021 Subchorionic hemorrhage: Small curvilinear hypoechoic right  subchorionic hemorrhage noted. Maternal uterus/adnexae: Right ovary not well demonstrated. Left ovary corpus luteum cyst evident. No free fluid. IMPRESSION: Single viable 9 week 0 day IUP. Small subchorionic hemorrhage noted. Electronically Signed   By: Judie Petit.  Shick M.D.   On: 10/15/2020 13:31    MAU Course: Orders Placed This Encounter  Procedures   Wet prep, genital   OB Urine Culture   US OB Comp Less 14 Wks   hCG, quantitative, pregnancy   CBC   Urinalysis, Routine w reflex microscopic Urine, Clean Catch   Pregnancy, urine POC   Discharge patient   Meds ordered this encounter  Medications   fosfomycin (MONUROL) 3 g PACK    Sig: Take 3 g by mouth once for 1 dose.    Dispense:  3 g    Refill:  0    Order Specific Question:   Supervising Provider    Answer:   Reva Bores [[id number]]    MDM: UA + nitrites, urine culture pending, rx sent CBC unremarkable HCG Wet prep and GC/CT collected [place] with results as above. IUP c/w with LMP and FHR 178bpm  Assessment: 1. Normal IUP (intrauterine pregnancy) on prenatal ultrasound, first trimester   2. Abdominal pain during pregnancy   3. [redacted] weeks gestation of pregnancy   4. UTI (urinary tract infection) during pregnancy, first trimester     Plan: Discharge home in stable condition  Patient to schedule appointment with OBGYN to establish prenatal care. List of providers given Follow up in MAU as needed    Allergies as of 10/15/2020       Reactions   Bee Venom Swelling   Unknown type of swelling   Penicillins Swelling   Has patient had a PCN reaction causing immediate rash, facial/tongue/throat swelling, SOB or lightheadedness with hypotension: No Has patient had a PCN reaction causing severe rash involving mucus membranes or skin necrosis: No Has patient had a PCN  reaction that required hospitalization No Has patient had a PCN reaction occurring within the last 10 years: No (childhood reaction) If all of the above answers are "NO", then may proceed with Cephalosporin use.        Medication List     STOP taking these medications    hydrocortisone-pramoxine rectal foam Commonly known as: Proctofoam HC   ondansetron 4 MG disintegrating tablet Commonly known as: Zofran ODT   oxyCODONE-acetaminophen 5-325 MG tablet Commonly known as: PERCOCET/ROXICET       TAKE these medications    EPINEPHrine 0.3 mg/0.3 mL Soaj injection Commonly known as: EPI-PEN Inject 0.3 mLs (0.3 mg total) into the muscle as needed for anaphylaxis.   fosfomycin 3 g Pack Commonly known as: MONUROL Take 3 g by mouth once for 1 dose.         Camelia Eng, MSN, CNM 10/15/2020 2:40 PM

## 2020-10-16 LAB — GC/CHLAMYDIA PROBE AMP (~~LOC~~) NOT AT ARMC
Chlamydia: NEGATIVE
Comment: NEGATIVE
Comment: NORMAL
Neisseria Gonorrhea: NEGATIVE

## 2020-10-17 LAB — CULTURE, OB URINE: Culture: >=100000 — AB

## 2020-10-31 ENCOUNTER — Telehealth (INDEPENDENT_AMBULATORY_CARE_PROVIDER_SITE_OTHER): Payer: Medicaid Other | Admitting: *Deleted

## 2020-10-31 ENCOUNTER — Other Ambulatory Visit: Payer: Self-pay

## 2020-10-31 DIAGNOSIS — O0990 Supervision of high risk pregnancy, unspecified, unspecified trimester: Secondary | ICD-10-CM | POA: Insufficient documentation

## 2020-10-31 DIAGNOSIS — O2340 Unspecified infection of urinary tract in pregnancy, unspecified trimester: Secondary | ICD-10-CM

## 2020-10-31 DIAGNOSIS — O09899 Supervision of other high risk pregnancies, unspecified trimester: Secondary | ICD-10-CM | POA: Insufficient documentation

## 2020-10-31 DIAGNOSIS — O09219 Supervision of pregnancy with history of pre-term labor, unspecified trimester: Secondary | ICD-10-CM

## 2020-10-31 DIAGNOSIS — Z8751 Personal history of pre-term labor: Secondary | ICD-10-CM | POA: Insufficient documentation

## 2020-10-31 DIAGNOSIS — Z3A00 Weeks of gestation of pregnancy not specified: Secondary | ICD-10-CM

## 2020-10-31 MED ORDER — BLOOD PRESSURE KIT DEVI: 1.0000 | 0 refills | Status: DC | PRN

## 2020-10-31 MED ORDER — COMPLETENATE 29-1 MG PO CHEW: 1.0000 | CHEWABLE_TABLET | Freq: Every day | ORAL | 11 refills | Status: DC

## 2020-10-31 MED ORDER — GOJJI WEIGHT SCALE MISC: 1.0000 | 0 refills | Status: DC | PRN

## 2020-10-31 NOTE — Progress Notes (Signed)
Called patient and changed new ob appointment to Dr.Eckstat  due to high risk -hx PTd-may need 17p-to appointment date/ time she is in agreement with . Shaivi Rothschild,RN

## 2020-10-31 NOTE — Progress Notes (Signed)
New OB Intake  I connected with  Misty Ewing on 10/31/20 at  8:15 AM EDT by MyChart Video Visit and verified that I am speaking with the correct person using two identifiers. Nurse is located at [facility] and pt is located at home.  I discussed the limitations, risks, security and privacy concerns of performing an evaluation and management service by telephone and the availability of in person appointments. I also discussed with the patient that there may be a patient responsible charge related to this service. The patient expressed understanding and agreed to proceed.  I explained I am completing New OB Intake today. We discussed her EDD of 05/20/21 that is based on [place]. She states she really isn't sure of her LMP, it was approximate.  Pt is G5/P3104. I reviewed her allergies, medications, Medical/Surgical/OB history, and appropriate screenings. I informed her of [facility] services. She declines referral at present. Based on history, this is a/an  pregnancy complicated by HX PTD  .   Patient Active Problem List   Diagnosis Date Noted   Supervision of high risk pregnancy, antepartum 10/31/2020   History of preterm delivery, currently pregnant 10/31/2020   UTI (urinary tract infection) during pregnancy     Concerns addressed today  Delivery Plans:  Plans to deliver at [facility].   MyChart/Babyscripts MyChart access verified. I explained pt will have some visits in office and some virtually. Babyscripts instructions given and order placed. Patient verifies receipt of registration text/e-mail. Account successfully created and app downloaded.  Blood Pressure Cuff  Blood pressure cuff ordered for patient to pick-up from [employer]. Explained after first prenatal appt pt will check weekly and document in Babyscripts.  Weight scale: Patient  does not   have weight scale. Weight scale ordered for patient to pick up form [facility].   Anatomy US Explained first scheduled [place] will be  around 19 weeks. Anatomy US scheduled for 12/24/20 at 0830. Pt notified to arrive at 0815.  Labs Discussed Avelina Laine genetic screening with patient. Would like Panorama drawn at new OB visit. Routine prenatal labs needed.  Covid Vaccine Patient has not  had covid vaccine.   Mother/ Baby Dyad Candidate?   No If yes, offer as possibility  Informed patient of [facility] website  and placed link in her AVS.   Social Determinants of Health Food Insecurity: Patient denies food insecurity. WIC Referral: Patient is not interested in referral to [facility]- she already has [facility] Transportation: Patient expressed transportation needs. Transportation Services reviewed with patient; patient registered and phone number provided for patient to schedule rides. Childcare: Discussed no children allowed at ultrasound appointments. Offered childcare services; patient declines childcare services at this time.  Send link to Pregnancy Navigators   Placed OB Box on problem list and updated  First visit review I reviewed new OB appt with pt. I explained she will have a pelvic exam, ob bloodwork with genetic screening, and PAP smear. Explained pt will be seen by Camelia Eng at first visit; encounter routed to appropriate provider. I explained her first visit provider and appointment may change due to  her history. I Explained that patient will be seen by pregnancy navigator following visit with provider. [facility]: Lambert Rhonda W information placed in AVS.   Zi Sek,RN 10/31/2020  9:24 AM

## 2020-10-31 NOTE — Patient Instructions (Addendum)
  At our [facility] OB[facility]GY[facility] Practices, we work as an integrated team, providing care to address both physical and emotional health. Your medical provider may refer you to see our Behavioral Health Clinician [facility]) on the same day you see your medical provider, as availability permits; often scheduled virtually at your convenience.  Our [facility] is available to all patients, visits generally last between 20-30 minutes, but can be longer or shorter, depending on patient need. The [facility] offers help with stress management, coping with symptoms of depression and anxiety, major life changes , sleep issues, changing risky behavior, grief and loss, life stress, working on personal life goals, and  behavioral health issues, as these all affect your overall health and wellness.  The [facility] is NOT available for the following: FMLA paperwork, court-ordered evaluations, specialty assessments (custody or disability), letters to employers, or obtaining certification for an emotional support animal. The [facility] does not provide long-term therapy. You have the right to refuse integrated behavioral health services, or to reschedule to see the [facility] at a later date.  Confidentiality exception: If it is suspected that a child or disabled adult is being abused or neglected, we are required by law to report that to either Child Protective Services or Adult Management consultant.  If you have a diagnosis of Bipolar affective disorder, Schizophrenia, or recurrent Major depressive disorder, we will recommend that you establish care with a psychiatrist, as these are lifelong, chronic conditions, and we want your overall emotional health and medications to be more closely monitored. If you anticipate needing extended maternity leave due to mental health issues postpartum, it it recommended you inform your medical provider, so we can put in a referral to a psychiatrist as soon as possible. The [facility] is unable to recommend an extended maternity leave for mental  health issues. Your medical provider or [facility] may refer you to a therapist for ongoing, traditional therapy, or to a psychiatrist, for medication management, if it would benefit your overall health. Depending on your insurance, you may have a copay or be charged a deductible, depending on your insurance, to see the [facility]. If you are uninsured, it is recommended that you apply for financial assistance. (Forms may be requested at the front desk for in-person visits, via MyChart, or request a form during a virtual visit).  If you see the [facility] more than 6 times, you will have to complete a comprehensive clinical assessment interview with the [facility] to resume integrated services.  For virtual visits with the [facility], you must be physically in the state of [place] at the time of the visit. For example, if you live in [place], you will have to do an in-person visit with the [facility], and your out-of-state insurance may not cover behavioral health services in [place]. If you are going out of the state or country for any reason, the [facility] may see you virtually when you return to [place], but not while you are physically outside of [place].     If you are in need of transportation to get to and from your appointments in our office.  You can reach Transportation Services by calling [phone] Monday - Friday  7am-6pm.    [email or web address] is a Chief Technology Officer  Here is a link to the Pregnancy Navigators . Please Fill out prior to your New OB appointment.   English Link: [email or web address]  Spanish Link: [email or web address]

## 2020-11-01 ENCOUNTER — Telehealth: Payer: Self-pay | Admitting: Family Medicine

## 2020-11-01 ENCOUNTER — Telehealth: Payer: Self-pay

## 2020-11-01 MED ORDER — ACETAMINOPHEN 325 MG PO CAPS: 2.0000 | ORAL_CAPSULE | ORAL | 0 refills | Status: DC | PRN

## 2020-11-01 MED ORDER — PREPLUS 27-1 MG PO TABS: 1.0000 | ORAL_TABLET | Freq: Every day | ORAL | 6 refills | Status: DC

## 2020-11-01 NOTE — Telephone Encounter (Signed)
   LAURELL COALSON DOB: 1991/02/12 MRN: [id number]   RIDER WAIVER AND RELEASE OF LIABILITY  For purposes of improving physical access to our facilities, [facility] is pleased to partner with third parties to provide [facility] patients or other authorized individuals the option of convenient, on-demand ground transportation services (the [employer]") through use of the technology service that enables users to request on-demand ground transportation from independent third-party providers.  By opting to use and accept these [employer], I, the undersigned, hereby agree on behalf of myself, and on behalf of any minor child using the Science writer for whom I am the parent or legal guardian, as follows:  Science writer provided to me are provided by independent third-party transportation providers who are not [employer] or employees and who are unaffiliated with [employer]. [place] is neither a transportation carrier nor a common or public carrier. [place] has no control over the quality or safety of the transportation that occurs as a result of the [employer]. [place] cannot guarantee that any third-party transportation provider will complete any arranged transportation service. [place] makes no representation, warranty, or guarantee regarding the reliability, timeliness, quality, safety, suitability, or availability of any of the Transport Services or that they will be error free. I fully understand that traveling by vehicle involves risks and dangers of serious bodily injury, including permanent disability, paralysis, and death. I agree, on behalf of myself and on behalf of any minor child using the Transport Services for whom I am the parent or legal guardian, that the entire risk arising out of my use of the [employer] remains solely with me, to the maximum extent permitted under applicable law. The [employer] are provided  "as is" and "as available." [place] disclaims all representations and warranties, express, implied or statutory, not expressly set out in these terms, including the implied warranties of merchantability and fitness for a particular purpose. I hereby waive and release [place], its agents, employees, officers, directors, representatives, insurers, attorneys, assigns, successors, subsidiaries, and affiliates from any and all past, present, or future claims, demands, liabilities, actions, causes of action, or suits of any kind directly or indirectly arising from acceptance and use of the [employer]. I further waive and release [place] and its affiliates from all present and future liability and responsibility for any injury or death to persons or damages to property caused by or related to the use of the [employer]. I have read this Waiver and Release of Liability, and I understand the terms used in it and their legal significance. This Waiver is freely and voluntarily given with the understanding that my right (as well as the right of any minor child for whom I am the parent or legal guardian using the [employer]) to legal recourse against  in connection with the [employer] is knowingly surrendered in return for use of these services.   I attest that I read the consent document to Glorious Peach, gave Ms. Watson the opportunity to ask questions and answered the questions asked (if any). I affirm that Glorious Peach then provided consent for she's participation in this program.      Launa Grill

## 2020-11-01 NOTE — Telephone Encounter (Signed)
Call placed to pt. Spoke with pt. Pt states wanting to have PNV pills called in vs taking the PNV gummies. New Rx for PNV pills sent to pharmacy.   Pt states having headaches, nausea, vomiting. Pt states has hx of migraines. Has not taken any Tylenol since no store has in stock. Asked for a Rx for Tylenol. Tylenol 325mg  2 tabs every 4 hrs PRN sent to pharmacy on file. Pt verbalized understanding. Pt also given safe medications list to mychart.  Pt has new OB appt on 9/7. Advised pt to take Tylenol and to try and eat and keep hydrated and if headaches do not resolve, then call or send mychart message. Pt verbalized understanding.  Pt took BP this morning 103/66. Sent into babyscripts.  Pt reassured this was a normal BP and to continue to monitor if has increasing HTN symptoms. Pt agreeable.   11/7, RN

## 2020-11-01 NOTE — Telephone Encounter (Signed)
Patient requesting a Rx for parental vitamins

## 2020-11-01 NOTE — Addendum Note (Signed)
Addended by: Isabell Jarvis on: 11/01/2020 05:05 PM   Modules accepted: Orders

## 2020-11-13 ENCOUNTER — Encounter (INDEPENDENT_AMBULATORY_CARE_PROVIDER_SITE_OTHER): Payer: Self-pay

## 2020-11-14 ENCOUNTER — Other Ambulatory Visit (HOSPITAL_COMMUNITY)
Admission: RE | Admit: 2020-11-14 | Discharge: 2020-11-14 | Disposition: A | Discharge status: Home / Self Care | Payer: Medicaid Other | Source: Ambulatory Visit | Attending: Family Medicine | Admitting: Family Medicine

## 2020-11-14 ENCOUNTER — Other Ambulatory Visit: Payer: Self-pay

## 2020-11-14 ENCOUNTER — Ambulatory Visit (INDEPENDENT_AMBULATORY_CARE_PROVIDER_SITE_OTHER): Payer: Medicaid Other | Admitting: Family Medicine

## 2020-11-14 ENCOUNTER — Encounter: Payer: Medicaid Other | Admitting: Family Medicine

## 2020-11-14 ENCOUNTER — Encounter: Payer: Self-pay | Admitting: Family Medicine

## 2020-11-14 VITALS — BP 105/68 | HR 76 | Wt 126.1 lb

## 2020-11-14 DIAGNOSIS — O2342 Unspecified infection of urinary tract in pregnancy, second trimester: Secondary | ICD-10-CM

## 2020-11-14 DIAGNOSIS — O0990 Supervision of high risk pregnancy, unspecified, unspecified trimester: Secondary | ICD-10-CM | POA: Insufficient documentation

## 2020-11-14 DIAGNOSIS — Z5941 Food insecurity: Secondary | ICD-10-CM

## 2020-11-14 DIAGNOSIS — O99340 Other mental disorders complicating pregnancy, unspecified trimester: Secondary | ICD-10-CM

## 2020-11-14 DIAGNOSIS — G43109 Migraine with aura, not intractable, without status migrainosus: Secondary | ICD-10-CM | POA: Insufficient documentation

## 2020-11-14 DIAGNOSIS — O09899 Supervision of other high risk pregnancies, unspecified trimester: Secondary | ICD-10-CM

## 2020-11-14 DIAGNOSIS — F419 Anxiety disorder, unspecified: Secondary | ICD-10-CM

## 2020-11-14 MED ORDER — CYCLOBENZAPRINE HCL 10 MG PO TABS: 10.0000 mg | ORAL_TABLET | Freq: Three times a day (TID) | ORAL | 1 refills | Status: DC | PRN

## 2020-11-14 MED ORDER — ACETAMINOPHEN 325 MG PO TABS: 650.0000 mg | ORAL_TABLET | Freq: Four times a day (QID) | ORAL | 1 refills | Status: DC | PRN

## 2020-11-14 NOTE — Patient Instructions (Signed)

## 2020-11-14 NOTE — Progress Notes (Signed)
Subjective:   Misty Ewing is a 30 y.o. [id number] at 79w2dby LMP, early ultrasound being seen today for her first obstetrical visit.  Her obstetrical history is significant for  n/a . Patient does not intend to breast feed. Pregnancy history fully reviewed.  Patient reports no complaints.  HISTORY: OB History  Gravida Para Term Preterm AB Living  5 4 3 1  0 4  SAB IAB Ectopic Multiple Live Births  0 0 0 0 4    # Outcome Date GA Lbr Len/2nd Weight Sex Delivery Anes PTL Lv  5 Current           4 Preterm 03/03/12 355w4d16:10 / 00:09 6 lb 1.7 oz (2.77 kg) F Vag-Spont EPI  LIV     Birth Comments: WNL     Name: Gressman,GIRL MARQUAYSHIA     Apgar1: 8  Apgar5: 9  3 Term 2011 4085w0d lb 12 oz (3.062 kg)  Vag-Spont EPI  LIV     Birth Comments: wnl  2 Term 2010 40w78w0dlb 6 oz (2.892 kg) M Vag-Spont EPI  LIV     Birth Comments: elevated bp at times, no meds per patient  1 Term 2008 40w034w0db 6 oz (2.892 kg)  Vag-Spont EPI  LIV     Birth Comments: wnl     Last pap smear: Lab Results  Component Value Date   DIAGPAP  02/12/2017    NEGATIVE FOR INTRAEPITHELIAL LESIONS OR MALIGNANCY.   DIAGPAP SHIFT IN FLORA SUGGESTIVE OF BACTERIAL VAGINOSIS. 02/12/2017   DIAGPAP TRICHOMONAS VAGINALIS PRESENT. 02/12/2017   Due for repeat  Past Medical History:  Diagnosis Date   Depression    Headache    UTI (urinary tract infection) during pregnancy    Past Surgical History:  Procedure Laterality Date   TOOTH EXTRACTION  2020   WISDOM TOOTH EXTRACTION  2020   Family History  Problem Relation Age of Onset   Hypertension Mother    Bipolar disorder Mother    Schizophrenia Mother    Anesthesia problems Neg Hx    Hypotension Neg Hx    Malignant hyperthermia Neg Hx    Pseudochol deficiency Neg Hx    Social History   Tobacco Use   Smoking status: Some Days    Types: Cigarettes   Smokeless tobacco: Never  Vaping Use   Vaping Use: Never used  Substance Use Topics   Alcohol  use: Not Currently    Comment: stopped in 2021   Drug use: Not Currently    Types: Marijuana    Comment: stopped 2011   Allergies  Allergen Reactions   Bee Venom Swelling    Unknown type of swelling   Penicillins Swelling    Has patient had a PCN reaction causing immediate rash, facial/tongue/throat swelling, SOB or lightheadedness with hypotension: No Has patient had a PCN reaction causing severe rash involving mucus membranes or skin necrosis: No Has patient had a PCN reaction that required hospitalization No Has patient had a PCN reaction occurring within the last 10 years: No (childhood reaction) If all of the above answers are "NO", then may proceed with Cephalosporin use.   Current Outpatient Medications on File Prior to Visit  Medication Sig Dispense Refill   Blood Pressure Monitoring (BLOOD PRESSURE KIT) DEVI 1 Device by Does not apply route as needed. 1 each 0   Misc. Devices (GOJJI WEIGHT SCALE) MISC 1 Device by Does not apply route as needed.  1 each 0   Prenatal Vit-Fe Fumarate-FA (PREPLUS) 27-1 MG TABS Take 1 tablet by mouth daily. 30 tablet 6   EPINEPHrine 0.3 mg/0.3 mL IJ SOAJ injection Inject 0.3 mLs (0.3 mg total) into the muscle as needed for anaphylaxis. (Patient not taking: Reported on 11/14/2020) 2 each 1   No current facility-administered medications on file prior to visit.     Exam   Vitals:   11/14/20 1307  BP: 105/68  Pulse: 76  Weight: 126 lb 1.6 oz (57.2 kg)   Fetal Heart Rate (bpm): 154  System: General: well-developed, well-nourished female in no acute distress   Skin: normal coloration and turgor, no rashes   Neurologic: oriented, normal, negative, normal mood   Extremities: normal strength, tone, and muscle mass, ROM of all joints is normal   HEENT PERRLA, extraocular movement intact and sclera clear, anicteric   Neck supple and no masses   Respiratory:  no respiratory distress      Assessment:   Pregnancy: [id number] Patient Active Problem  List   Diagnosis Date Noted   Migraine with aura and without status migrainosus, not intractable 11/14/2020   Supervision of high risk pregnancy, antepartum 10/31/2020   History of preterm delivery, currently pregnant 10/31/2020   UTI (urinary tract infection) during pregnancy      Plan:  1. Supervision of high risk pregnancy, antepartum Initial labs drawn. Pap collected by female provider per patient preference Continue prenatal vitamins. Genetic Screening discussed, NIPS: ordered. Ultrasound discussed; fetal anatomic survey: ordered. Problem list reviewed and updated. The nature of [facility] with multiple MDs and other Advanced Practice Providers was explained to patient; also emphasized that residents, students are part of our team.  2. Urinary tract infection in mother during second trimester of pregnancy Treated in MAU TOC today  3. History of preterm delivery, currently pregnant Del at 35 weeks ten years prior, three deliveries before that were full term Discussed that recent studies show minimal benefit to [facility], and given her history of multiple term deliveries would not recommend Makena She declines Makena  4. Migraines Reports headache currently, long hx of migraines with aura Declines magnesium prophylaxis Rx sent for tylenol and flexeril  Routine obstetric precautions reviewed. Return in 4 weeks (on 12/12/2020) for [facility], ob visit.

## 2020-11-15 LAB — CBC/D/PLT+RPR+RH+ABO+RUBIGG...
Antibody Screen: NEGATIVE
Basophils Absolute: 0 10*3/uL (ref 0.0–0.2)
Basos: 1 %
EOS (ABSOLUTE): 0.3 10*3/uL (ref 0.0–0.4)
Eos: 4 %
HCV Ab: <0.1 s/co ratio (ref 0.0–0.9)
HIV Screen 4th Generation wRfx: NONREACTIVE
Hematocrit: 38.9 % (ref 34.0–46.6)
Hemoglobin: 13.2 g/dL (ref 11.1–15.9)
Hepatitis B Surface Ag: NEGATIVE
Immature Grans (Abs): 0 10*3/uL (ref 0.0–0.1)
Immature Granulocytes: 0 %
Lymphocytes Absolute: 2.6 10*3/uL (ref 0.7–3.1)
Lymphs: 32 %
MCH: 31 pg (ref 26.6–33.0)
MCHC: 33.9 g/dL (ref 31.5–35.7)
MCV: 91 fL (ref 79–97)
Monocytes Absolute: 0.4 10*3/uL (ref 0.1–0.9)
Monocytes: 5 %
Neutrophils Absolute: 4.8 10*3/uL (ref 1.4–7.0)
Neutrophils: 58 %
Platelets: 256 10*3/uL (ref 150–450)
RBC: 4.26 x10E6/uL (ref 3.77–5.28)
RDW: 13 % (ref 11.7–15.4)
RPR Ser Ql: NONREACTIVE
Rh Factor: POSITIVE
Rubella Antibodies, IGG: 19.2 index (ref >0.99)
WBC: 8.1 10*3/uL (ref 3.4–10.8)

## 2020-11-15 LAB — CERVICOVAGINAL ANCILLARY ONLY
Bacterial Vaginitis (gardnerella): POSITIVE — AB
Candida Glabrata: NEGATIVE
Candida Vaginitis: POSITIVE — AB
Comment: NEGATIVE
Comment: NEGATIVE
Comment: NEGATIVE
Comment: NEGATIVE
Trichomonas: NEGATIVE

## 2020-11-15 LAB — HCV INTERPRETATION

## 2020-11-15 LAB — HEMOGLOBIN A1C
Est. average glucose Bld gHb Est-mCnc: 105 mg/dL
Hgb A1c MFr Bld: 5.3 % (ref 4.8–5.6)

## 2020-11-15 MED ORDER — METRONIDAZOLE 0.75 % VA GEL: 1.0000 | Freq: Every day | VAGINAL | 0 refills | Status: AC

## 2020-11-15 MED ORDER — FLUCONAZOLE 150 MG PO TABS: 150.0000 mg | ORAL_TABLET | Freq: Once | ORAL | 0 refills | Status: AC

## 2020-11-15 NOTE — Addendum Note (Signed)
Addended by: Merian Capron on: 11/15/2020 03:22 PM   Modules accepted: Orders

## 2020-11-16 LAB — CULTURE, OB URINE

## 2020-11-16 LAB — URINE CULTURE, OB REFLEX

## 2020-11-23 ENCOUNTER — Encounter: Payer: Self-pay | Admitting: Family Medicine

## 2020-11-23 DIAGNOSIS — R87611 Atypical squamous cells cannot exclude high grade squamous intraepithelial lesion on cytologic smear of cervix (ASC-H): Secondary | ICD-10-CM | POA: Insufficient documentation

## 2020-11-23 LAB — CYTOLOGY - PAP
Chlamydia: NEGATIVE
Comment: NEGATIVE
Comment: NEGATIVE
Comment: NEGATIVE
Comment: NORMAL
Diagnosis: HIGH — AB
HPV 16: NEGATIVE
HPV 18 / 45: NEGATIVE
High risk HPV: POSITIVE — AB
Neisseria Gonorrhea: NEGATIVE

## 2020-11-26 ENCOUNTER — Encounter: Payer: Self-pay | Admitting: General Practice

## 2020-12-18 ENCOUNTER — Other Ambulatory Visit: Payer: Self-pay

## 2020-12-18 ENCOUNTER — Ambulatory Visit (INDEPENDENT_AMBULATORY_CARE_PROVIDER_SITE_OTHER): Payer: Medicaid Other | Admitting: Family Medicine

## 2020-12-18 ENCOUNTER — Other Ambulatory Visit (HOSPITAL_COMMUNITY)
Admission: RE | Admit: 2020-12-18 | Discharge: 2020-12-18 | Disposition: A | Discharge status: Home / Self Care | Payer: Medicaid Other | Source: Ambulatory Visit | Attending: Family Medicine | Admitting: Family Medicine

## 2020-12-18 VITALS — BP 100/55 | HR 87 | Wt 127.1 lb

## 2020-12-18 DIAGNOSIS — O23592 Infection of other part of genital tract in pregnancy, second trimester: Secondary | ICD-10-CM | POA: Diagnosis not present

## 2020-12-18 DIAGNOSIS — O0990 Supervision of high risk pregnancy, unspecified, unspecified trimester: Secondary | ICD-10-CM

## 2020-12-18 DIAGNOSIS — G44209 Tension-type headache, unspecified, not intractable: Secondary | ICD-10-CM

## 2020-12-18 DIAGNOSIS — Z3A18 18 weeks gestation of pregnancy: Secondary | ICD-10-CM

## 2020-12-18 NOTE — Progress Notes (Signed)
   Subjective:  Misty Ewing is a 30 y.o. T6L4650 at [redacted]w[redacted]d being seen today for ongoing prenatal care.  She is currently monitored for the following issues for this Ewing-risk pregnancy and has Supervision of high risk pregnancy, antepartum; History of preterm delivery, currently pregnant; UTI (urinary tract infection) during pregnancy; Migraine with aura and without status migrainosus, not intractable; and Atypical squamous cells cannot exclude high grade squamous intraepithelial lesion on cytologic smear of cervix (ASC-H) on their problem list.  Patient reports no complaints.  Contractions: Not present. Vag. Bleeding: None.  Movement: Present. Denies leaking of fluid.   The following portions of the patient's history were reviewed and updated as appropriate: allergies, current medications, past family history, past medical history, past social history, past surgical history and problem list. Problem list updated.  Objective:   Vitals:   12/18/20 0938  BP: (!) 100/55  Pulse: 87  Weight: 127 lb 1.6 oz (57.7 kg)    Fetal Status: Fetal Heart Rate (bpm): 143   Movement: Present     General:  Alert, oriented and cooperative. Patient is in no acute distress.  Skin: Skin is warm and dry. No rash noted.   Cardiovascular: Normal heart rate noted  Respiratory: Normal respiratory effort, no problems with respiration noted  Abdomen: Soft, gravid, appropriate for gestational age. Pain/Pressure: Present     Pelvic: Vag. Bleeding: None     Cervical exam deferred        Extremities: Normal range of motion.  Edema: None  Mental Status: Normal mood and affect. Normal behavior. Normal judgment and thought content.   Assessment and Plan:  Pregnancy: P5W6568 at [redacted]w[redacted]d  1. Supervision of high risk pregnancy, antepartum 2. [redacted] weeks gestation of pregnancy Doing well. Denies any acute concerns. Discussed lab results as listed below.  - Follow up in 4 weeks  3. Tension-type headache, not  intractable, unspecified chronicity pattern - Flexeril not helping and Tylenol not helping - sleeping throughout day in small bursts but not getting much sleep at night that is consecutive hours - Discussed staying hydrated and trying to avoid naps in day so can sleep for longer and more consecutive hours at night.   4. Abnormal PAP, due for colposcopy Discussed in detail abnormal PAP results including the HPV result and answered patient's questions in detail. Patient wanting to ensure that HPV will not affect her pregnancy which we provided her reassurance about Discussed recommendation for colposcopy and patient amenable and will schedule before leaving today - requests HPV diagnosis not be disclosed to partner during any visits that she is here with him  Preterm labor symptoms and general obstetric precautions including but not limited to vaginal bleeding, contractions, leaking of fluid and fetal movement were reviewed in detail with the patient. Please refer to After Visit Summary for other counseling recommendations.  Return in about 4 weeks (around 01/15/2021) for LROB and also needs Colposcopy appt in next 1-2 weeks.   Warner Mccreedy, MD, MPH OB Fellow, Faculty Practice

## 2020-12-19 LAB — CERVICOVAGINAL ANCILLARY ONLY
Bacterial Vaginitis (gardnerella): NEGATIVE
Candida Glabrata: NEGATIVE
Candida Vaginitis: POSITIVE — AB
Chlamydia: NEGATIVE
Comment: NEGATIVE
Comment: NEGATIVE
Comment: NEGATIVE
Comment: NEGATIVE
Comment: NEGATIVE
Comment: NORMAL
Neisseria Gonorrhea: NEGATIVE
Trichomonas: NEGATIVE

## 2020-12-20 ENCOUNTER — Other Ambulatory Visit: Payer: Self-pay | Admitting: Family Medicine

## 2020-12-20 LAB — AFP, SERUM, OPEN SPINA BIFIDA
AFP MoM: 1.09
AFP Value: 61.7 ng/mL
Gest. Age on Collection Date: 18.1 weeks
Maternal Age At EDD: 32.3 yr
OSBR Risk 1 IN: 10000
Test Results:: NEGATIVE
Weight: 127 [lb_av]

## 2020-12-20 MED ORDER — TERCONAZOLE 0.4 % VA CREA: 1.0000 | TOPICAL_CREAM | Freq: Every day | VAGINAL | 0 refills | Status: AC

## 2020-12-24 ENCOUNTER — Ambulatory Visit: Payer: Medicaid Other | Admitting: *Deleted

## 2020-12-24 ENCOUNTER — Encounter: Payer: Self-pay | Admitting: *Deleted

## 2020-12-24 ENCOUNTER — Other Ambulatory Visit: Payer: Self-pay | Admitting: *Deleted

## 2020-12-24 ENCOUNTER — Ambulatory Visit: Payer: Medicaid Other

## 2020-12-24 ENCOUNTER — Other Ambulatory Visit: Payer: Self-pay

## 2020-12-24 VITALS — BP 97/53 | HR 82

## 2020-12-24 DIAGNOSIS — O0990 Supervision of high risk pregnancy, unspecified, unspecified trimester: Secondary | ICD-10-CM | POA: Insufficient documentation

## 2020-12-24 DIAGNOSIS — O09212 Supervision of pregnancy with history of pre-term labor, second trimester: Secondary | ICD-10-CM

## 2020-12-24 DIAGNOSIS — O2342 Unspecified infection of urinary tract in pregnancy, second trimester: Secondary | ICD-10-CM

## 2020-12-24 DIAGNOSIS — O09899 Supervision of other high risk pregnancies, unspecified trimester: Secondary | ICD-10-CM | POA: Diagnosis present

## 2020-12-24 DIAGNOSIS — Z3A19 19 weeks gestation of pregnancy: Secondary | ICD-10-CM

## 2020-12-24 DIAGNOSIS — O0992 Supervision of high risk pregnancy, unspecified, second trimester: Secondary | ICD-10-CM | POA: Diagnosis not present

## 2020-12-24 DIAGNOSIS — Z362 Encounter for other antenatal screening follow-up: Secondary | ICD-10-CM

## 2020-12-24 DIAGNOSIS — Z3689 Encounter for other specified antenatal screening: Secondary | ICD-10-CM | POA: Diagnosis not present

## 2020-12-24 DIAGNOSIS — O99332 Smoking (tobacco) complicating pregnancy, second trimester: Secondary | ICD-10-CM | POA: Diagnosis not present

## 2021-01-09 ENCOUNTER — Encounter: Payer: Self-pay | Admitting: Family Medicine

## 2021-01-10 ENCOUNTER — Ambulatory Visit: Payer: Medicaid Other | Admitting: Obstetrics & Gynecology

## 2021-01-16 ENCOUNTER — Telehealth (INDEPENDENT_AMBULATORY_CARE_PROVIDER_SITE_OTHER): Payer: Medicaid Other | Admitting: Obstetrics & Gynecology

## 2021-01-16 ENCOUNTER — Encounter: Payer: Self-pay | Admitting: Obstetrics & Gynecology

## 2021-01-16 DIAGNOSIS — O0990 Supervision of high risk pregnancy, unspecified, unspecified trimester: Secondary | ICD-10-CM

## 2021-01-16 DIAGNOSIS — O09212 Supervision of pregnancy with history of pre-term labor, second trimester: Secondary | ICD-10-CM

## 2021-01-16 DIAGNOSIS — O09899 Supervision of other high risk pregnancies, unspecified trimester: Secondary | ICD-10-CM

## 2021-01-16 DIAGNOSIS — Z3A22 22 weeks gestation of pregnancy: Secondary | ICD-10-CM

## 2021-01-16 DIAGNOSIS — O99891 Other specified diseases and conditions complicating pregnancy: Secondary | ICD-10-CM

## 2021-01-16 DIAGNOSIS — R87611 Atypical squamous cells cannot exclude high grade squamous intraepithelial lesion on cytologic smear of cervix (ASC-H): Secondary | ICD-10-CM

## 2021-01-16 NOTE — Progress Notes (Signed)
OBSTETRICS PRENATAL VIRTUAL VISIT ENCOUNTER NOTE  Provider location: Center for [place] [facility]   Patient location: Home  I connected with Misty Ewing on 01/16/21 at 10:55 AM EST by MyChart Video Encounter and verified that I am speaking with the correct person using two identifiers. I discussed the limitations, risks, security and privacy concerns of performing an evaluation and management service virtually and the availability of in person appointments. I also discussed with the patient that there may be a patient responsible charge related to this service. The patient expressed understanding and agreed to proceed. Subjective:  Misty Ewing is a 30 y.o. [id number] at [redacted]w[redacted]d being seen today for ongoing prenatal care.  She is currently monitored for the following issues for this high-risk pregnancy and has Supervision of high risk pregnancy, antepartum; History of preterm delivery, currently pregnant; UTI (urinary tract infection) during pregnancy; Migraine with aura and without status migrainosus, not intractable; and Atypical squamous cells cannot exclude high grade squamous intraepithelial lesion on cytologic smear of cervix (ASC-H) on their problem list.  Patient reports no complaints.  Contractions: Not present. Vag. Bleeding: None.  Movement: Present. Denies any leaking of fluid.   The following portions of the patient's history were reviewed and updated as appropriate: allergies, current medications, past family history, past medical history, past social history, past surgical history and problem list.   Objective:  There were no vitals filed for this visit.  Fetal Status:     Movement: Present     General:  Alert, oriented and cooperative. Patient is in no acute distress.  Respiratory: Normal respiratory effort, no problems with respiration noted  Mental Status: Normal mood and affect. Normal behavior. Normal judgment and thought content.   Rest of physical exam deferred due to type of encounter  Imaging: [place] MFM OB DETAIL +14 WK  Result Date: 12/24/2020 ----------------------------------------------------------------------  OBSTETRICS REPORT                       (Signed Final 12/24/2020 10:04 am) ---------------------------------------------------------------------- Patient Info  ID #:       [id number]                          D.O.B.:  08-Jun-1990 (30 yrs)  Name:       Misty Ewing            Visit Date: 12/24/2020 08:28 am ---------------------------------------------------------------------- Performed By  Attending:        Tama High MD        Secondary Phy.:   [facility]                                                             [facility]  Performed By:     Jacob Moores BS,       Address:          [address]  [place], [place]                                                             [address]  Referred By:      Mary Sella              Location:         [facility] for [facility] MD                               [facility] at                                                             [facility]                                                             [facility]  Ref. Address:     [address]                    [place], [place]                    [address] ---------------------------------------------------------------------- Orders  #  Description                           Code        Ordered By  1  [place] MFM OB DETAIL +14 WK               [id number]    Merian Capron ----------------------------------------------------------------------  #  Order #                     Accession #                Episode #  1  [id number]                   [id number]                 [id number] ---------------------------------------------------------------------- Indications  Encounter for antenatal screening for           Z36.3  malformations  [redacted] weeks gestation of pregnancy                Z3A.19  Poor obstetric history: Previous preterm       O09.219  delivery, antepartum (35 weeks)  Tobacco use complicating pregnancy,            O99.332  second trimester ---------------------------------------------------------------------- Fetal Evaluation  Num Of Fetuses:  1  Fetal Heart Rate(bpm):  147  Cardiac Activity:       Observed  Presentation:           Breech  Placenta:               Left lateral  P. Cord Insertion:      Visualized, central  Amniotic Fluid  AFI FV:      Within normal limits                              Largest Pocket(cm)                              6.6 ---------------------------------------------------------------------- Biometry  BPD:        43  mm     G. Age:  19w 0d         38  %    CI:        72.52   %    70 - 86                                                          FL/HC:      17.6   %    16.1 - 18.3  HC:      160.6  mm     G. Age:  18w 6d         24  %    HC/AC:      1.11        1.09 - 1.39  AC:      144.2  mm     G. Age:  19w 5d         61  %    FL/BPD:     65.8   %  FL:       28.3  mm     G. Age:  18w 5d         22  %    FL/AC:      19.6   %    20 - 24  HUM:      26.4  mm     G. Age:  18w 2d         26  %  CER:      19.8  mm     G. Age:  19w 1d         44  %  NFT:       4.6  mm  LV:        5.4  mm  CM:        4.3  mm  Est. FW:     280  gm    0 lb 10 oz      41  % ---------------------------------------------------------------------- OB History  Blood Type:   A+  Gravidity:    5         Term:   3        Prem:   1        SAB:   0  TOP:          0       Ectopic:  0  Living: 4 ---------------------------------------------------------------------- Gestational Age  LMP:           19w 2d        Date:  08/11/20                 EDD:   05/18/21  U/S Today:     19w 1d                                        EDD:   05/19/21  Best:          19w 2d     Det. By:  LMP  (08/11/20)          EDD:   05/18/21  ---------------------------------------------------------------------- Anatomy  Cranium:               Appears normal         Aortic Arch:            Appears normal  Cavum:                 Appears normal         Ductal Arch:            Appears normal  Ventricles:            Appears normal         Diaphragm:              Appears normal  Choroid Plexus:        Appears normal         Stomach:                Appears normal, left                                                                        sided  Cerebellum:            Appears normal         Abdomen:                Appears normal  Posterior Fossa:       Appears normal         Abdominal Wall:         Appears nml (cord                                                                        insert, abd wall)  Nuchal Fold:           Appears normal         Cord Vessels:           Appears normal (3  vessel cord)  Face:                  Appears normal         Kidneys:                Appear normal                         (orbits and profile)  Lips:                  Appears normal         Bladder:                Appears normal  Thoracic:              Appears normal         Spine:                  Not well visualized  Heart:                 Appears normal         Upper Extremities:      Appears normal                         (4CH, axis, and                         situs)  RVOT:                  Appears normal         Lower Extremities:      Appears normal  LVOT:                  Appears normal  Other:  Fetus appears to be female. Nasal bone, Lenses, visualized. 3VV and          3VTV visualized. Technically difficult due to fetal position. ---------------------------------------------------------------------- Cervix Uterus Adnexa  Cervix  Length:           4.04  cm.  Normal appearance by transabdominal scan.  Uterus  No abnormality visualized.  Right Ovary  Within normal limits.  Left Ovary  Within  normal limits.  Cul De Sac  No free fluid seen.  Adnexa  No abnormality visualized. ---------------------------------------------------------------------- Impression  G5 P4.  Patient is here for fetal anatomy scan.  On cell-free fetal DNA screening, the risks of fetal  aneuploidies are not increased .MSAFP screening showed  low risk for open-neural tube defects .  Obstetric history significant for 3 term vaginal deliveries  followed by a preterm delivery in at [redacted] weeks gestation.  We performed fetal anatomy scan. No makers of  aneuploidies or fetal structural defects are seen. Fetal  biometry is consistent with her previously-established dates.  Amniotic fluid is normal and good fetal activity is seen.  Patient understands the limitations of ultrasound in detecting  fetal anomalies. ---------------------------------------------------------------------- Recommendations  -An appointment was made for her to return in 4 weeks for  completion of fetal anatomy (fetal spine). ----------------------------------------------------------------------                  Tama High, MD Electronically Signed Final Report   12/24/2020 10:04 am ----------------------------------------------------------------------   Assessment and Plan:  Pregnancy: [id number] at [redacted]w[redacted]d 1. History of preterm delivery, currently pregnant PTL precautions reviewed  2. Atypical squamous cells cannot exclude high  grade squamous intraepithelial lesion on cytologic smear of cervix (ASC-H) Scheduled for colposcopy tomorrow, will follow up results.  3. [redacted] weeks gestation of pregnancy 4. Supervision of high risk pregnancy, antepartum Low risk NIPS, AFP negative. Follow up scan ordered for incomplete anatomy scan. Preterm labor symptoms and general obstetric precautions including but not limited to vaginal bleeding, contractions, leaking of fluid and fetal movement were reviewed in detail with the patient. I discussed the assessment and treatment plan  with the patient. The patient was provided an opportunity to ask questions and all were answered. The patient agreed with the plan and demonstrated an understanding of the instructions. The patient was advised to call back or seek an in-person office evaluation/go to [facility]U at [facility] for any urgent or concerning symptoms. Please refer to After Visit Summary for other counseling recommendations.   I provided 8 minutes of face-to-face time during this encounter.  Return in about 4 weeks (around 02/13/2021) for OFFICE OB VISIT (MD only).  Future Appointments  Date Time Provider [place]  01/16/2021 10:55 AM Katherine Syme, Sallyanne Havers, MD [facility]  01/17/2021  1:15 PM Genia Del, MD [facility]  01/21/2021  2:30 PM [facility] NURSE [facility]  01/21/2021  2:45 PM [facility] US5 [facility] [place]    Verita Schneiders, MD Center for [facility] [place]

## 2021-01-17 ENCOUNTER — Ambulatory Visit (INDEPENDENT_AMBULATORY_CARE_PROVIDER_SITE_OTHER): Payer: Medicaid Other | Admitting: Family Medicine

## 2021-01-17 ENCOUNTER — Other Ambulatory Visit: Payer: Self-pay

## 2021-01-17 VITALS — BP 99/63 | HR 74 | Wt 127.0 lb

## 2021-01-17 DIAGNOSIS — O0990 Supervision of high risk pregnancy, unspecified, unspecified trimester: Secondary | ICD-10-CM

## 2021-01-17 DIAGNOSIS — R87611 Atypical squamous cells cannot exclude high grade squamous intraepithelial lesion on cytologic smear of cervix (ASC-H): Secondary | ICD-10-CM | POA: Diagnosis not present

## 2021-01-17 DIAGNOSIS — R87810 Cervical high risk human papillomavirus (HPV) DNA test positive: Secondary | ICD-10-CM | POA: Diagnosis not present

## 2021-01-17 DIAGNOSIS — O2342 Unspecified infection of urinary tract in pregnancy, second trimester: Secondary | ICD-10-CM

## 2021-01-17 DIAGNOSIS — O09899 Supervision of other high risk pregnancies, unspecified trimester: Secondary | ICD-10-CM

## 2021-01-17 NOTE — Progress Notes (Signed)
    GYNECOLOGY OFFICE COLPOSCOPY PROCEDURE NOTE  30 y.o. [id number] here for colposcopy for ASCUS with POSITIVE high risk HPV pap smear on 11/14/20.   Patient is currently [redacted]w[redacted]d gestation.   Informed consent and review of risks, benefits and alternatives performed. Written consent given.   Speculum inserted into patient's vagina assuring full view of cervix and vaginal walls. 3 swabs of vinegar solution applied to the cervix and vaginal walls and colposcope was used to observe both the cervix and vaginal walls.   Colposcopy adequate? Yes No visible lesions. Cervical ectropion noted. Cervical biopsies declined by patient due to discomfort. ECC not collected due to pregnancy.  Patient tolerated well without complication.  Plan for follow up pap smear in one year.   Evalina Field, MD  OB Fellow  Faculty Practice

## 2021-01-18 ENCOUNTER — Encounter: Payer: Self-pay | Admitting: Family Medicine

## 2021-01-21 ENCOUNTER — Other Ambulatory Visit: Payer: Self-pay

## 2021-01-21 ENCOUNTER — Other Ambulatory Visit: Payer: Self-pay | Admitting: *Deleted

## 2021-01-21 ENCOUNTER — Ambulatory Visit: Payer: Medicaid Other | Admitting: *Deleted

## 2021-01-21 ENCOUNTER — Ambulatory Visit: Payer: Medicaid Other | Attending: Obstetrics and Gynecology

## 2021-01-21 VITALS — BP 103/60 | HR 100

## 2021-01-21 DIAGNOSIS — O09899 Supervision of other high risk pregnancies, unspecified trimester: Secondary | ICD-10-CM | POA: Insufficient documentation

## 2021-01-21 DIAGNOSIS — Z3A23 23 weeks gestation of pregnancy: Secondary | ICD-10-CM | POA: Diagnosis not present

## 2021-01-21 DIAGNOSIS — O09212 Supervision of pregnancy with history of pre-term labor, second trimester: Secondary | ICD-10-CM

## 2021-01-21 DIAGNOSIS — Z362 Encounter for other antenatal screening follow-up: Secondary | ICD-10-CM

## 2021-01-21 DIAGNOSIS — O09892 Supervision of other high risk pregnancies, second trimester: Secondary | ICD-10-CM

## 2021-01-21 DIAGNOSIS — O0990 Supervision of high risk pregnancy, unspecified, unspecified trimester: Secondary | ICD-10-CM | POA: Insufficient documentation

## 2021-01-21 DIAGNOSIS — O99332 Smoking (tobacco) complicating pregnancy, second trimester: Secondary | ICD-10-CM

## 2021-02-11 ENCOUNTER — Ambulatory Visit: Payer: Medicaid Other

## 2021-02-13 ENCOUNTER — Ambulatory Visit (INDEPENDENT_AMBULATORY_CARE_PROVIDER_SITE_OTHER): Payer: Medicaid Other | Admitting: Licensed Clinical Social Worker

## 2021-02-13 ENCOUNTER — Other Ambulatory Visit: Payer: Self-pay

## 2021-02-13 DIAGNOSIS — F419 Anxiety disorder, unspecified: Secondary | ICD-10-CM | POA: Diagnosis not present

## 2021-02-13 DIAGNOSIS — O99340 Other mental disorders complicating pregnancy, unspecified trimester: Secondary | ICD-10-CM

## 2021-02-14 ENCOUNTER — Ambulatory Visit (INDEPENDENT_AMBULATORY_CARE_PROVIDER_SITE_OTHER): Payer: Medicaid Other | Admitting: Obstetrics & Gynecology

## 2021-02-14 ENCOUNTER — Other Ambulatory Visit: Payer: Self-pay

## 2021-02-14 VITALS — BP 97/60 | HR 83 | Wt 126.0 lb

## 2021-02-14 DIAGNOSIS — O09899 Supervision of other high risk pregnancies, unspecified trimester: Secondary | ICD-10-CM

## 2021-02-14 DIAGNOSIS — O0990 Supervision of high risk pregnancy, unspecified, unspecified trimester: Secondary | ICD-10-CM

## 2021-02-14 DIAGNOSIS — O210 Mild hyperemesis gravidarum: Secondary | ICD-10-CM

## 2021-02-14 MED ORDER — PANTOPRAZOLE SODIUM 20 MG PO TBEC
20.0000 mg | DELAYED_RELEASE_TABLET | Freq: Every day | ORAL | 3 refills | Status: DC
Start: 2021-02-14 — End: 2021-04-24

## 2021-02-14 MED ORDER — METOCLOPRAMIDE HCL 5 MG PO TABS: 5.0000 mg | ORAL_TABLET | Freq: Three times a day (TID) | ORAL | 2 refills | Status: DC

## 2021-02-14 MED ORDER — ONDANSETRON 4 MG PO TBDP: 4.0000 mg | ORAL_TABLET | Freq: Four times a day (QID) | ORAL | 0 refills | Status: DC | PRN

## 2021-02-14 NOTE — Progress Notes (Signed)
Patient reports daily braxton hicks contractions along with pain. She also stated that she cannot keep any solids or liquids down.

## 2021-02-14 NOTE — Progress Notes (Signed)
   PRENATAL VISIT NOTE  Subjective:  Misty Ewing is a 30 y.o. Z6W1093 at [redacted]w[redacted]d being seen today for ongoing prenatal care.  She is currently monitored for the following issues for this high-risk pregnancy and has Supervision of high risk pregnancy, antepartum; History of preterm delivery, currently pregnant; UTI (urinary tract infection) during pregnancy; Migraine with aura and without status migrainosus, not intractable; and Atypical squamous cells cannot exclude high grade squamous intraepithelial lesion on cytologic smear of cervix (ASC-H) on their problem list.  Patient reports nausea and vomiting.  Contractions: Irritability. Vag. Bleeding: None.  Movement: Present. Denies leaking of fluid.   The following portions of the patient's history were reviewed and updated as appropriate: allergies, current medications, past family history, past medical history, past social history, past surgical history and problem list.   Objective:   Vitals:   02/14/21 1343  BP: 97/60  Pulse: 83  Weight: 126 lb (57.2 kg)    Fetal Status: Fetal Heart Rate (bpm): 144 Fundal Height: 26 cm Movement: Present     General:  Alert, oriented and cooperative. Patient is in no acute distress.  Skin: Skin is warm and dry. No rash noted.   Cardiovascular: Normal heart rate noted  Respiratory: Normal respiratory effort, no problems with respiration noted  Abdomen: Soft, gravid, appropriate for gestational age.  Pain/Pressure: Present     Pelvic: Cervical exam deferred        Extremities: Normal range of motion.  Edema: None  Mental Status: Normal mood and affect. Normal behavior. Normal judgment and thought content.   Assessment and Plan:  Pregnancy: A3F5732 at [redacted]w[redacted]d 1. Supervision of high risk pregnancy, antepartum Poor weight gain  2. History of preterm delivery, currently pregnant   3. Hyperemesis affecting pregnancy, antepartum Add medication - metoCLOPramide (REGLAN) 5 MG tablet; Take 1 tablet  (5 mg total) by mouth 3 (three) times daily before meals.  Dispense: 60 tablet; Refill: 2 - ondansetron (ZOFRAN-ODT) 4 MG disintegrating tablet; Take 1 tablet (4 mg total) by mouth every 6 (six) hours as needed for nausea.  Dispense: 20 tablet; Refill: 0 - pantoprazole (PROTONIX) 20 MG tablet; Take 1 tablet (20 mg total) by mouth daily.  Dispense: 30 tablet; Refill: 3  Preterm labor symptoms and general obstetric precautions including but not limited to vaginal bleeding, contractions, leaking of fluid and fetal movement were reviewed in detail with the patient. Please refer to After Visit Summary for other counseling recommendations.   Return in about 2 weeks (around 02/28/2021).  Future Appointments  Date Time Provider Department Center  03/12/2021  2:30 PM [facility] NURSE [facility]  03/12/2021  2:45 PM [facility] US5 [facility]    Scheryl Darter, MD

## 2021-02-14 NOTE — BH Specialist Note (Signed)
Integrated Behavioral Health via Telemedicine Visit  02/14/2021 Misty Ewing [id number]  Number of Integrated Behavioral Health visits: 1 Session Start time: 1030am  Session End time: 1104am Total time: 34 mins via mychart video   Referring Provider: Mr. Crissie Reese Patient/Family location: Home  [facility] Provider location: [place]  All persons participating in visit: Misty Ewing and LCSW A. Estefania Kamiya  Types of Service: Individual psychotherapy  I connected with Misty Ewing and/or Misty Ewing's n/a via n/aTelephone or Video Enabled Telemedicine Application  (Video is Caregility application) and verified that I am speaking with the correct person using two identifiers. Discussed confidentiality: Yes   I discussed the limitations of telemedicine and the availability of in person appointments.  Discussed there is a possibility of technology failure and discussed alternative modes of communication if that failure occurs.  I discussed that engaging in this telemedicine visit, they consent to the provision of behavioral healthcare and the services will be billed under their insurance.  Patient and/or legal guardian expressed understanding and consented to Telemedicine visit: Yes   Presenting Concerns: Patient and/or family reports the following symptoms/concerns: anxiety Duration of problem: over ; Severity of problem: mild  Patient and/or Family's Strengths/Protective Factors: Concrete supports in place (healthy food, safe environments, etc.)  Goals Addressed: Patient will:  Reduce symptoms of: depression   Increase knowledge and/or ability of: coping skills   Demonstrate ability to: Increase adequate support systems for patient/family  Progress towards Goals: Ongoing  Interventions: Interventions utilized:  Supportive Counseling Standardized Assessments completed: PHQ 9    Assessment: Patient currently experiencing anxiety.   Patient may benefit from  integrated behavioral health.  Plan: Follow up with behavioral health clinician on : 3 weeks via mychart  Behavioral recommendations: Collaborate with housing resources for stable housing, prioritize rest, attend scheduled appts.  Referral(s): Integrated [employer] (In Clinic)  I discussed the assessment and treatment plan with the patient and/or parent/guardian. They were provided an opportunity to ask questions and all were answered. They agreed with the plan and demonstrated an understanding of the instructions.   They were advised to call back or seek an in-person evaluation if the symptoms worsen or if the condition fails to improve as anticipated.  Gwyndolyn Saxon, LCSW

## 2021-03-07 ENCOUNTER — Telehealth: Payer: Self-pay | Admitting: Licensed Clinical Social Worker

## 2021-03-07 ENCOUNTER — Encounter: Payer: Medicaid Other | Admitting: Licensed Clinical Social Worker

## 2021-03-07 NOTE — Telephone Encounter (Signed)
Called pt regarding mychart visit. Left phone number with pt mother requesting callback. No information regarding visit was given to 3rd party.

## 2021-03-08 ENCOUNTER — Other Ambulatory Visit: Payer: Medicaid Other

## 2021-03-08 ENCOUNTER — Ambulatory Visit (INDEPENDENT_AMBULATORY_CARE_PROVIDER_SITE_OTHER): Payer: Medicaid Other | Admitting: Nurse Practitioner

## 2021-03-08 ENCOUNTER — Other Ambulatory Visit: Payer: Self-pay

## 2021-03-08 VITALS — BP 94/53 | HR 86 | Wt 128.0 lb

## 2021-03-08 DIAGNOSIS — O0990 Supervision of high risk pregnancy, unspecified, unspecified trimester: Secondary | ICD-10-CM

## 2021-03-08 DIAGNOSIS — Z5941 Food insecurity: Secondary | ICD-10-CM

## 2021-03-08 DIAGNOSIS — Z23 Encounter for immunization: Secondary | ICD-10-CM | POA: Diagnosis not present

## 2021-03-08 DIAGNOSIS — G44209 Tension-type headache, unspecified, not intractable: Secondary | ICD-10-CM

## 2021-03-08 DIAGNOSIS — F419 Anxiety disorder, unspecified: Secondary | ICD-10-CM

## 2021-03-08 DIAGNOSIS — Z3A29 29 weeks gestation of pregnancy: Secondary | ICD-10-CM

## 2021-03-08 DIAGNOSIS — O99340 Other mental disorders complicating pregnancy, unspecified trimester: Secondary | ICD-10-CM

## 2021-03-08 DIAGNOSIS — O2243 Hemorrhoids in pregnancy, third trimester: Secondary | ICD-10-CM

## 2021-03-08 DIAGNOSIS — O210 Mild hyperemesis gravidarum: Secondary | ICD-10-CM

## 2021-03-08 DIAGNOSIS — O09899 Supervision of other high risk pregnancies, unspecified trimester: Secondary | ICD-10-CM

## 2021-03-08 MED ORDER — WITCH HAZEL-GLYCERIN EX PADS: 1.0000 "application " | MEDICATED_PAD | CUTANEOUS | 12 refills | Status: DC | PRN

## 2021-03-08 MED ORDER — HYDROCORTISONE (PERIANAL) 2.5 % EX CREA: 1.0000 "application " | TOPICAL_CREAM | Freq: Two times a day (BID) | CUTANEOUS | 0 refills | Status: DC

## 2021-03-09 LAB — CBC
Hematocrit: 32.4 % — ABNORMAL LOW (ref 34.0–46.6)
Hemoglobin: 11.2 g/dL (ref 11.1–15.9)
MCH: 32.1 pg (ref 26.6–33.0)
MCHC: 34.6 g/dL (ref 31.5–35.7)
MCV: 93 fL (ref 79–97)
Platelets: 310 10*3/uL (ref 150–450)
RBC: 3.49 x10E6/uL — ABNORMAL LOW (ref 3.77–5.28)
RDW: 12.7 % (ref 11.7–15.4)
WBC: 8.4 10*3/uL (ref 3.4–10.8)

## 2021-03-09 LAB — GLUCOSE TOLERANCE, 2 HOURS W/ 1HR
Glucose, 1 hour: 93 mg/dL (ref 70–179)
Glucose, 2 hour: 87 mg/dL (ref 70–152)
Glucose, Fasting: 71 mg/dL (ref 70–91)

## 2021-03-09 LAB — RPR: RPR Ser Ql: NONREACTIVE

## 2021-03-09 LAB — HIV ANTIBODY (ROUTINE TESTING W REFLEX): HIV Screen 4th Generation wRfx: NONREACTIVE

## 2021-03-09 NOTE — Progress Notes (Signed)
° ° °  Subjective:  Misty Ewing is a 30 y.o. [id number] at [redacted]w[redacted]d being seen today for ongoing prenatal care.  She is currently monitored for the following issues for this high-risk pregnancy and has Supervision of high risk pregnancy, antepartum; History of preterm delivery, currently pregnant; UTI (urinary tract infection) during pregnancy; Migraine with aura and without status migrainosus, not intractable; and Atypical squamous cells cannot exclude high grade squamous intraepithelial lesion on cytologic smear of cervix (ASC-H) on their problem list.  Patient reports  hemorrhoids .  Contractions: Irritability. Vag. Bleeding: None.  Movement: Present. Denies leaking of fluid.   The following portions of the patient's history were reviewed and updated as appropriate: allergies, current medications, past family history, past medical history, past social history, past surgical history and problem list. Problem list updated.  Objective:   Vitals:   03/08/21 0828  BP: (!) 94/53  Pulse: 86  Weight: 128 lb (58.1 kg)    Fetal Status: Fetal Heart Rate (bpm): 144 Fundal Height: 28 cm Movement: Present     General:  Alert, oriented and cooperative. Patient is in no acute distress.  Skin: Skin is warm and dry. No rash noted.   Cardiovascular: Normal heart rate noted  Respiratory: Normal respiratory effort, no problems with respiration noted  Abdomen: Soft, gravid, appropriate for gestational age. Pain/Pressure: Present     Pelvic:  Cervical exam deferred        Extremities: Normal range of motion.  Edema: None  Mental Status: Normal mood and affect. Normal behavior. Normal judgment and thought content.   Urinalysis:      Assessment and Plan:  Pregnancy: [id number] at [redacted]w[redacted]d  1. Supervision of high risk pregnancy, antepartum Baby moving well. One pound weight gain - states she does not gain weight well in any of her pregnancies  - Tdap vaccine greater than or equal to 7yo IM - Flu Vaccine  QUAD 36+ mos IM (Fluarix, Quad PF)  2. History of preterm delivery, currently pregnant No abdominal pain  3. Hyperemesis affecting pregnancy, antepartum Many foods make her nauseated, is not vomiting daily Spitting see at her visit today, but states it does not always happen. Advised to avoid high fat foods  4. Anxiety during pregnancy Missed virtual appointment with Sue Lush Still wants to meet with her and will need to schedule on a day off from work  5. Tension-type headache, not intractable, unspecified chronicity pattern Not having headaches at this time  6. Food insecurity  - AMBULATORY REFERRAL TO [facility]  7. Hemorrhoids during pregnancy in third trimester Prescribed external cream and will need to get Tucks OTC Is not using Zofran  8. [redacted] weeks gestation of pregnancy   Preterm labor symptoms and general obstetric precautions including but not limited to vaginal bleeding, contractions, leaking of fluid and fetal movement were reviewed in detail with the patient. Please refer to After Visit Summary for other counseling recommendations.  Return in about 2 weeks (around 03/22/2021) for  In person ROB.  Nolene Bernheim, RN, MSN, NP-BC Nurse Practitioner, [facility] for [employer], [facility] 03/09/2021 9:06 AM

## 2021-03-10 NOTE — L&D Delivery Note (Signed)
OB/GYN Faculty Practice Delivery Note  LORALIE MALTA is a 31 y.o. [id number] s/p SVD at [redacted]w[redacted]d. She was admitted for IOL d/t FGR.   ROM: 1h 59m with clear fluid GBS Status: Negative Maximum Maternal Temperature: 98  Labor Progress: Low dose pitocin started on admission and titrated up per unit policy Epidural administered AROM @ 0922  Delivery Date/Time: 10:59 Delivery: Called to room and patient was complete and pushing. Head delivered LOA. Loose nuchal cord present x 1, reduced. Shoulder and body delivered in usual fashion. Infant with spontaneous cry, placed on mother's abdomen, dried and stimulated. Cord clamped x 2 after 1-minute delay, and cut by father of baby under my direct supervision. Cord blood drawn. Placenta delivered spontaneously with gentle cord traction. Fundus firm with massage and Pitocin. Labia, perineum, vagina, and cervix were inspected, and there were no lacerations.   Placenta: complete, three vessel cord appreciated Complications: None Lacerations: None EBL: 25 mL Analgesia: Epidural  Postpartum Planning [x]  message to sent to schedule follow-up  [x]  vaccines UTD  Infant: girl   APGARs 8,9   2265 g  , MD Center for [facility], [facility]

## 2021-03-12 ENCOUNTER — Ambulatory Visit: Payer: Medicaid Other | Attending: Obstetrics

## 2021-03-12 ENCOUNTER — Ambulatory Visit (HOSPITAL_BASED_OUTPATIENT_CLINIC_OR_DEPARTMENT_OTHER): Payer: Medicaid Other | Admitting: *Deleted

## 2021-03-12 ENCOUNTER — Other Ambulatory Visit: Payer: Self-pay | Admitting: Obstetrics

## 2021-03-12 ENCOUNTER — Other Ambulatory Visit: Payer: Self-pay | Admitting: *Deleted

## 2021-03-12 ENCOUNTER — Other Ambulatory Visit: Payer: Self-pay

## 2021-03-12 ENCOUNTER — Ambulatory Visit: Payer: Medicaid Other | Admitting: *Deleted

## 2021-03-12 VITALS — BP 106/55 | HR 84

## 2021-03-12 DIAGNOSIS — Z3A30 30 weeks gestation of pregnancy: Secondary | ICD-10-CM | POA: Diagnosis not present

## 2021-03-12 DIAGNOSIS — O99332 Smoking (tobacco) complicating pregnancy, second trimester: Secondary | ICD-10-CM | POA: Insufficient documentation

## 2021-03-12 DIAGNOSIS — E669 Obesity, unspecified: Secondary | ICD-10-CM

## 2021-03-12 DIAGNOSIS — O365930 Maternal care for other known or suspected poor fetal growth, third trimester, not applicable or unspecified: Secondary | ICD-10-CM | POA: Insufficient documentation

## 2021-03-12 DIAGNOSIS — O2343 Unspecified infection of urinary tract in pregnancy, third trimester: Secondary | ICD-10-CM | POA: Insufficient documentation

## 2021-03-12 DIAGNOSIS — O09899 Supervision of other high risk pregnancies, unspecified trimester: Secondary | ICD-10-CM

## 2021-03-12 DIAGNOSIS — O09213 Supervision of pregnancy with history of pre-term labor, third trimester: Secondary | ICD-10-CM | POA: Diagnosis not present

## 2021-03-12 DIAGNOSIS — O0990 Supervision of high risk pregnancy, unspecified, unspecified trimester: Secondary | ICD-10-CM

## 2021-03-12 DIAGNOSIS — O99333 Smoking (tobacco) complicating pregnancy, third trimester: Secondary | ICD-10-CM | POA: Diagnosis not present

## 2021-03-12 NOTE — Procedures (Signed)
Misty Ewing [birth year] [redacted]w[redacted]d  [name] Non-Stress Test Interpretation for 03/12/21  Indication: IUGR  Fetal Heart Rate A Mode: External Baseline Rate (A): 135 bpm Variability: Moderate Accelerations: 10 x 10 Decelerations: None Multiple birth?: No  Uterine Activity Mode: Palpation, Toco Contraction Frequency (min): None Resting Tone Palpated: Relaxed Resting Time: Adequate  Interpretation (Fetal Testing) Nonstress Test Interpretation: Reactive Overall Impression: Reassuring for gestational age Comments: Dr. Parke Poisson reviewed tracing.

## 2021-03-19 ENCOUNTER — Ambulatory Visit: Payer: Medicaid Other | Admitting: *Deleted

## 2021-03-19 ENCOUNTER — Other Ambulatory Visit: Payer: Self-pay

## 2021-03-19 ENCOUNTER — Other Ambulatory Visit: Payer: Self-pay | Admitting: Obstetrics

## 2021-03-19 ENCOUNTER — Encounter: Payer: Self-pay | Admitting: *Deleted

## 2021-03-19 ENCOUNTER — Ambulatory Visit: Payer: Medicaid Other | Attending: Obstetrics

## 2021-03-19 VITALS — BP 103/61 | HR 87

## 2021-03-19 DIAGNOSIS — O365930 Maternal care for other known or suspected poor fetal growth, third trimester, not applicable or unspecified: Secondary | ICD-10-CM

## 2021-03-19 DIAGNOSIS — O0990 Supervision of high risk pregnancy, unspecified, unspecified trimester: Secondary | ICD-10-CM | POA: Diagnosis present

## 2021-03-19 DIAGNOSIS — O09213 Supervision of pregnancy with history of pre-term labor, third trimester: Secondary | ICD-10-CM

## 2021-03-19 DIAGNOSIS — O09899 Supervision of other high risk pregnancies, unspecified trimester: Secondary | ICD-10-CM

## 2021-03-19 DIAGNOSIS — O2343 Unspecified infection of urinary tract in pregnancy, third trimester: Secondary | ICD-10-CM | POA: Insufficient documentation

## 2021-03-19 DIAGNOSIS — Z3A31 31 weeks gestation of pregnancy: Secondary | ICD-10-CM

## 2021-03-19 NOTE — Procedures (Signed)
Misty Ewing 12-14-1990 [redacted]w[redacted]d  [name] Non-Stress Test Interpretation for 03/19/21  Indication: IUGR  Fetal Heart Rate A Mode: External Baseline Rate (A): 130 bpm Variability: Moderate Accelerations: 15 x 15 Decelerations: None Multiple birth?: No  Uterine Activity Mode: Palpation, Toco Contraction Frequency (min): UI Contraction Quality: Mild Resting Tone Palpated: Relaxed Resting Time: Adequate  Interpretation (Fetal Testing) Nonstress Test Interpretation: Reactive Comments: Dr. Donalee Citrin reviewed tracing.

## 2021-03-20 ENCOUNTER — Ambulatory Visit (INDEPENDENT_AMBULATORY_CARE_PROVIDER_SITE_OTHER): Payer: Medicaid Other | Admitting: Licensed Clinical Social Worker

## 2021-03-20 DIAGNOSIS — F419 Anxiety disorder, unspecified: Secondary | ICD-10-CM

## 2021-03-20 DIAGNOSIS — O99340 Other mental disorders complicating pregnancy, unspecified trimester: Secondary | ICD-10-CM | POA: Diagnosis not present

## 2021-03-21 NOTE — BH Specialist Note (Signed)
Integrated Behavioral Health Follow Up In-Person Visit  MRN: [id number] Name: Misty Ewing  Number of [place] Clinician visits: 2/6 Session Start time: 200pm  Session End time: 217pm Total time: 17 minutes via mychart video  Types of Service: Individual psychotherapy and Video visit  Interpretor:No. Interpretor Name and Language: none  Subjective: Misty Ewing is a 31 y.o. female accompanied by n/a Patient was referred by Dr Dione Plover for anxiety. Patient reports the following symptoms/concerns: anxiety Duration of problem: approx 6 months; Severity of problem: mild  Objective: Mood: Anxious and Affect: Appropriate Risk of harm to self or others: No plan to harm self or others  Life Context: Family and Social: Lives with family  School/Work: n/a Self-Care: n/a Life Changes: Unplanned pregnancy  Patient and/or Family's Strengths/Protective Factors: Concrete supports in place (healthy food, safe environments, etc.)  Goals Addressed: Patient will:  Reduce symptoms of: anxiety   Increase knowledge and/or ability of: coping skills and stress reduction   Demonstrate ability to: Increase healthy adjustment to current life circumstances  Progress towards Goals: Ongoing  Interventions: Interventions utilized:  Supportive Counseling Standardized Assessments completed: PHQ 9   Assessment: Patient currently experiencing anxiety affecting pregnancy.   Patient may benefit from integrated behavioral health.  Plan: Follow up with behavioral health clinician on :  Behavioral recommendations:  Collaborate with housing resources for stable housing, prioritize rest, attend scheduled appts.  Referral(s): [place] (In Clinic) "From scale of 1-10, how likely are you to follow plan?":   Lynnea Ferrier, LCSW

## 2021-03-22 ENCOUNTER — Encounter: Payer: Self-pay | Admitting: Obstetrics and Gynecology

## 2021-03-22 ENCOUNTER — Ambulatory Visit (INDEPENDENT_AMBULATORY_CARE_PROVIDER_SITE_OTHER): Payer: Medicaid Other | Admitting: Obstetrics and Gynecology

## 2021-03-22 ENCOUNTER — Other Ambulatory Visit: Payer: Self-pay

## 2021-03-22 VITALS — BP 100/49 | HR 103 | Wt 129.1 lb

## 2021-03-22 DIAGNOSIS — Z3A31 31 weeks gestation of pregnancy: Secondary | ICD-10-CM

## 2021-03-22 DIAGNOSIS — O0990 Supervision of high risk pregnancy, unspecified, unspecified trimester: Secondary | ICD-10-CM

## 2021-03-22 DIAGNOSIS — O365930 Maternal care for other known or suspected poor fetal growth, third trimester, not applicable or unspecified: Secondary | ICD-10-CM

## 2021-03-22 DIAGNOSIS — O365990 Maternal care for other known or suspected poor fetal growth, unspecified trimester, not applicable or unspecified: Secondary | ICD-10-CM | POA: Insufficient documentation

## 2021-03-22 NOTE — Progress Notes (Signed)
U/S on 03/19/21:  Impression  Severe fetal growth restriction.  On ultrasound performed last  week, the estimated fetal weight was at the 3rd percentile.  Amniotic fluid is normal and good fetal activity is seen .  Umbilical artery Doppler showed normal forward diastolic  flow.  Antenatal testing is reassuring.  NST is reactive.  BPP  10/10. ---------------------------------------------------------------------- Recommendations  - Continue weekly BPP, NST and UA Doppler studies till  delivery. ----------------------------------------------------------------------                  Noralee Space, MD

## 2021-03-22 NOTE — Progress Notes (Signed)
° °  PRENATAL VISIT NOTE  Subjective:  Misty Ewing is a 31 y.o. [id number] at [redacted]w[redacted]d being seen today for ongoing prenatal care.  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  Patient reports  ? Contraction last night for ten mins and white, stringy vag d/c .  Contractions: Not present. Vag. Bleeding: Bloody Show.  Movement: Present. Denies leaking of fluid.   The following portions of the patient's history were reviewed and updated as appropriate: allergies, current medications, past family history, past medical history, past social history, past surgical history and problem list.   Objective:   Vitals:   03/22/21 1038  BP: (!) 100/49  Pulse: (!) 103  Weight: 129 lb 1.6 oz (58.6 kg)    Fetal Status: Fetal Heart Rate (bpm): 147   Movement: Present     General:  Alert, oriented and cooperative. Patient is in no acute distress.  Skin: Skin is warm and dry. No rash noted.   Cardiovascular: Normal heart rate noted  Respiratory: Normal respiratory effort, no problems with respiration noted  Abdomen: Soft, gravid, appropriate for gestational age.  Pain/Pressure: Present     Pelvic: Cervical exam deferred        Extremities: Normal range of motion.  Edema: Trace  Mental Status: Normal mood and affect. Normal behavior. Normal judgment and thought content.   Assessment and Plan:  Pregnancy: [id number] at 100w4d 1. [redacted] weeks gestation of pregnancy Pt told if UCs like last night persistent for an hour to come to the hospital. She likely  has normal vag d/c. ROM precautions given  2. Supervision of high risk pregnancy, antepartum  3. Poor fetal growth affecting management of mother in third trimester, single or unspecified fetus Next growth on 1/23. I toldher at that time that we'll likely have a better idea on delivery timing but likely around 37wks. Continue with weekly dopplers. [place] precautions given  1/10: bpp 10/10, afi 12.7 with normal ua dopplers 1/3: 2.6%, 1236gm, ac 3.5%, normal ua dopplers  Preterm labor symptoms and general obstetric precautions including but not limited to vaginal bleeding, contractions, leaking of fluid and fetal movement were reviewed in detail with the patient. Please refer to After Visit Summary for other counseling recommendations.   Return in about 10 days (around 04/01/2021) for high risk ob, md visit, in person.  Future Appointments  Date Time Provider [place]  03/26/2021 12:30 PM [facility] NURSE [facility]  03/26/2021 12:45 PM [facility] US5 [facility]  03/26/2021  1:15 PM [facility] NST [facility]  04/01/2021  1:30 PM [facility] NURSE [facility]  04/01/2021  1:45 PM [facility] US6 [facility]  04/01/2021  2:15 PM [facility] NST [facility]  04/09/2021  1:30 PM [facility] NURSE [facility]  04/09/2021  1:45 PM [facility] US6 [facility]  04/09/2021  2:15 PM [facility] NST [facility]  04/17/2021  2:30 PM [facility] NURSE [facility]  04/17/2021  2:45 PM [facility] US5 [facility]  04/17/2021  4:00 PM [facility] NST [facility]    Aletha Halim, MD

## 2021-03-26 ENCOUNTER — Ambulatory Visit: Payer: Medicaid Other | Admitting: *Deleted

## 2021-03-26 ENCOUNTER — Other Ambulatory Visit: Payer: Self-pay

## 2021-03-26 ENCOUNTER — Ambulatory Visit: Payer: Medicaid Other | Attending: Obstetrics

## 2021-03-26 ENCOUNTER — Other Ambulatory Visit: Payer: Self-pay | Admitting: Obstetrics

## 2021-03-26 VITALS — BP 108/56 | HR 90

## 2021-03-26 DIAGNOSIS — Z3A32 32 weeks gestation of pregnancy: Secondary | ICD-10-CM | POA: Diagnosis not present

## 2021-03-26 DIAGNOSIS — O09899 Supervision of other high risk pregnancies, unspecified trimester: Secondary | ICD-10-CM | POA: Diagnosis present

## 2021-03-26 DIAGNOSIS — O0990 Supervision of high risk pregnancy, unspecified, unspecified trimester: Secondary | ICD-10-CM | POA: Insufficient documentation

## 2021-03-26 DIAGNOSIS — O2343 Unspecified infection of urinary tract in pregnancy, third trimester: Secondary | ICD-10-CM | POA: Diagnosis present

## 2021-03-26 DIAGNOSIS — O365930 Maternal care for other known or suspected poor fetal growth, third trimester, not applicable or unspecified: Secondary | ICD-10-CM

## 2021-03-26 NOTE — Procedures (Signed)
Misty Ewing [birth year] [redacted]w[redacted]d  [name] Non-Stress Test Interpretation for 03/26/21  Indication: IUGR  Fetal Heart Rate A Mode: External Baseline Rate (A): 150 bpm Variability: Moderate Accelerations: 15 x 15 Decelerations: None Multiple birth?: No  Uterine Activity Mode: Palpation, Toco Contraction Frequency (min): UI Contraction Quality: Mild Resting Tone Palpated: Relaxed Resting Time: Adequate  Interpretation (Fetal Testing) Nonstress Test Interpretation: Reactive Comments: Dr. Annamaria Boots reviewed tracing.

## 2021-04-01 ENCOUNTER — Ambulatory Visit (INDEPENDENT_AMBULATORY_CARE_PROVIDER_SITE_OTHER): Payer: Medicaid Other | Admitting: Obstetrics & Gynecology

## 2021-04-01 ENCOUNTER — Other Ambulatory Visit: Payer: Self-pay | Admitting: Obstetrics

## 2021-04-01 ENCOUNTER — Ambulatory Visit: Payer: Medicaid Other | Admitting: *Deleted

## 2021-04-01 ENCOUNTER — Other Ambulatory Visit (HOSPITAL_COMMUNITY)
Admission: RE | Admit: 2021-04-01 | Discharge: 2021-04-01 | Disposition: A | Discharge status: Home / Self Care | Payer: Medicaid Other | Source: Ambulatory Visit | Attending: Obstetrics and Gynecology | Admitting: Obstetrics and Gynecology

## 2021-04-01 ENCOUNTER — Other Ambulatory Visit: Payer: Self-pay | Admitting: *Deleted

## 2021-04-01 ENCOUNTER — Encounter: Payer: Self-pay | Admitting: *Deleted

## 2021-04-01 ENCOUNTER — Ambulatory Visit: Payer: Medicaid Other | Attending: Obstetrics

## 2021-04-01 ENCOUNTER — Ambulatory Visit (HOSPITAL_BASED_OUTPATIENT_CLINIC_OR_DEPARTMENT_OTHER): Payer: Medicaid Other | Admitting: Obstetrics

## 2021-04-01 ENCOUNTER — Other Ambulatory Visit: Payer: Self-pay

## 2021-04-01 VITALS — BP 100/58 | HR 86 | Wt 128.0 lb

## 2021-04-01 VITALS — BP 100/58 | HR 86

## 2021-04-01 DIAGNOSIS — O99333 Smoking (tobacco) complicating pregnancy, third trimester: Secondary | ICD-10-CM | POA: Diagnosis not present

## 2021-04-01 DIAGNOSIS — O09899 Supervision of other high risk pregnancies, unspecified trimester: Secondary | ICD-10-CM

## 2021-04-01 DIAGNOSIS — O365930 Maternal care for other known or suspected poor fetal growth, third trimester, not applicable or unspecified: Secondary | ICD-10-CM

## 2021-04-01 DIAGNOSIS — Z3A33 33 weeks gestation of pregnancy: Secondary | ICD-10-CM

## 2021-04-01 DIAGNOSIS — O0990 Supervision of high risk pregnancy, unspecified, unspecified trimester: Secondary | ICD-10-CM | POA: Insufficient documentation

## 2021-04-01 DIAGNOSIS — O26893 Other specified pregnancy related conditions, third trimester: Secondary | ICD-10-CM | POA: Diagnosis present

## 2021-04-01 DIAGNOSIS — B3731 Acute candidiasis of vulva and vagina: Secondary | ICD-10-CM

## 2021-04-01 DIAGNOSIS — N898 Other specified noninflammatory disorders of vagina: Secondary | ICD-10-CM | POA: Diagnosis present

## 2021-04-01 DIAGNOSIS — O365931 Maternal care for other known or suspected poor fetal growth, third trimester, fetus 1: Secondary | ICD-10-CM | POA: Diagnosis present

## 2021-04-01 NOTE — Progress Notes (Signed)
PRENATAL VISIT NOTE  Subjective:  Misty Ewing is a 31 y.o. [id number] at [redacted]w[redacted]d being seen today for ongoing prenatal care.  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  Patient reports  pelvic pressure and possibly seeing a spot of blood when wiping .  Contractions: Irritability. Vag. Bleeding: None.  Movement: Present. Denies leaking of fluid.   The following portions of the patient's history were reviewed and updated as appropriate: allergies, current medications, past family history, past medical history, past social history, past surgical history and problem list.   Objective:   Vitals:   04/01/21 1542  BP: (!) 100/58  Pulse: 86  Weight: 128 lb (58.1 kg)    Fetal Status: Fetal Heart Rate (bpm): 136   Movement: Present  Presentation: Vertex  General:  Alert, oriented and cooperative. Patient is in no acute distress.  Skin: Skin is warm and dry. No rash noted.   Cardiovascular: Normal heart rate noted  Respiratory: Normal respiratory effort, no problems with respiration noted  Abdomen: Soft, gravid, appropriate for gestational age.  Pain/Pressure: Absent     Pelvic: Cervical exam performed in the presence of a chaperone Dilation: Closed Effacement (%): Thick Station: Ballotable. No bleeding seen on speculum exam but thick, yellow curdy discharge seen. Testing sample obtained.   Extremities: Normal range of motion.  Edema: None  Mental Status: Normal mood and affect. Normal behavior. Normal judgment and thought  content.   Imaging: [place] MFM FETAL BPP W/NONSTRESS  Result Date: 04/01/2021 ----------------------------------------------------------------------  OBSTETRICS REPORT                       (Signed Final 04/01/2021 03:35 pm) ---------------------------------------------------------------------- Patient Info  ID #:       [id number]                          D.O.B.:  1990-07-29 (30 yrs)  Name:       Misty Ewing            Visit Date: 04/01/2021 02:06 pm ---------------------------------------------------------------------- Performed By  Attending:        Johnell Comings MD         Secondary Phy.:   [facility]                                                             [facility]  Performed By:     Margaretann Loveless     Address:          [address]  [place], [place]  Referred By:      Annice Needy              Location:         [facility]                               [facility] at                                                             [facility]                                                             [facility]  Ref. Address:     [address] [place]                    [place], [place] ---------------------------------------------------------------------- Orders  #  Description                           Code        Ordered By  1  [place] MFM OB FOLLOW UP                   [id number]    Peterson Ao  2  [place] MFM UA CORD DOPPLER                76820.02    YU FANG  3  [place] MFM FETAL BPP                      [id number]     Peterson Ao     W/NONSTRESS ----------------------------------------------------------------------  #  Order #                     Accession #                Episode #  1  [id number]                   [id number]                 [id number]  2  [id number]                    [id number]  [id number]  3  [id number]                   [id number]                 [id number] ---------------------------------------------------------------------- Indications  Maternal care for known or suspected poor      O36.5930  fetal growth, third trimester, not applicable or  unspecified IUGR  Poor obstetric history: Previous preterm       O09.219  delivery, antepartum (35 weeks)  Tobacco use complicating pregnancy, third      O99.333  trimester  [redacted] weeks gestation of pregnancy                Z3A.33 ---------------------------------------------------------------------- Fetal Evaluation  Num Of Fetuses:         1  Fetal Heart Rate(bpm):  136  Cardiac Activity:       Observed  Presentation:           Cephalic  Placenta:               Left lateral  P. Cord Insertion:      Previously Visualized  Amniotic Fluid  AFI FV:      Within normal limits  AFI Sum(cm)     %Tile       Largest Pocket(cm)  9.38            13          4.71  RUQ(cm)       RLQ(cm)       LUQ(cm)        LLQ(cm)  4.71          1.74          0              2.93 ---------------------------------------------------------------------- Biophysical Evaluation  Amniotic F.V:   Pocket => 2 cm             F. Tone:        Observed  F. Movement:    Observed                   N.S.T:          Reactive  F. Breathing:   Observed                   Score:          10/10 ---------------------------------------------------------------------- Biometry  BPD:      78.6  mm     G. Age:  31w 4d          6  %    CI:        75.94   %    70 - 86                                                          FL/HC:      20.7   %    19.9 - 21.5  HC:      285.9  mm     G. Age:  31w 3d        < 1  %    HC/AC:      1.04        0.96 - 1.11  AC:      274.9  mm     G. Age:  31w 4d         10  %    FL/BPD:     75.4   %    71 - 87  FL:       59.3  mm     G. Age:  30w 6d        2.2  %    FL/AC:      21.6   %    20 - 24  HUM:      49.7  mm     G. Age:  29w 1d        < 5  %  Est.  FW:    1748  gm    3 lb 14 oz     4.6  % ---------------------------------------------------------------------- OB History  Blood Type:   A+  Gravidity:    5         Term:   3        Prem:   1        SAB:   0  TOP:          0       Ectopic:  0        Living: 4 ---------------------------------------------------------------------- Gestational Age  LMP:           33w 2d        Date:  08/11/20                 EDD:   05/18/21  U/S Today:     31w 3d                                        EDD:   05/31/21  Best:          33w 2d     Det. By:  LMP  (08/11/20)          EDD:   05/18/21 ---------------------------------------------------------------------- Anatomy  Cranium:               Appears normal         LVOT:                   Appears normal  Cavum:                 Appears normal         Aortic Arch:            Previously seen  Ventricles:            Previously seen        Ductal Arch:            Previously seen  Choroid Plexus:        Previously seen        Diaphragm:              Appears normal  Cerebellum:            Appears normal         Stomach:                Appears normal, left  sided  Posterior Fossa:       Previously seen        Abdomen:                Appears normal  Nuchal Fold:           Not applicable (Q000111Q    Abdominal Wall:         Previously seen                         wks GA)  Face:                  Orbits and profile     Cord Vessels:           Previously seen                         previously seen  Lips:                  Previously seen        Kidneys:                Appear normal  Palate:                Previously seen        Bladder:                Appears normal  Thoracic:              Previously seen        Spine:                  Previously seen  Heart:                 Appears normal         Upper Extremities:      Previously seen                         (4CH, axis, and                         situs)  RVOT:                   Previously seen        Lower Extremities:      Previously seen  Other:  Female gender previously seen.Nasal bone and lenses prevoiously          visualized. VC, 3VV and 3VTV previously visualized. Technically          difficult due to fetal position. ---------------------------------------------------------------------- Doppler - Fetal Vessels  Umbilical Artery   S/D     %tile      RI    %tile      PI    %tile            ADFV    RDFV   2.89       68    0.65       72    0.99       72               No      No ---------------------------------------------------------------------- Cervix Uterus Adnexa  Cervix  Not visualized (advanced GA >24wks)  Uterus  Normal shape and size.  Right Ovary  Within normal limits.  Left Ovary  Within normal  limits.  Cul De Sac  No free fluid seen.  Adnexa  No adnexal mass visualized. ---------------------------------------------------------------------- Comments  Adella Nissen was seen due to an IUGR fetus. She  reports feeling vigorous fetal movements throughout the day.  She reports frequent feeling painful contractions throughout  the day.  A growth ultrasound performed today shows an EFW of 3  pounds 14 ounces (5th percentile).  There was normal  amniotic fluid with a total AFI of 9.4 cm.  The fetus has grown  close to one pound over the past 3 weeks.  A biophysical profile performed today was 10 out of 10 with a  reactive NST.  Doppler studies of the umbilical arteries performed due to  fetal growth restriction showed a normal S/D ratio of 2.89.  There were no signs of absent or reversed end-diastolic flow  noted today.  Due to fetal growth restriction, we will continue to follow her  with weekly fetal testing until delivery.  The patient was advised that the goal for her delivery would  be at between 36 to 37 weeks.  Her induction should be  scheduled during this time.  She will return in 1 week for another umbilical artery Doppler  study and BPP.  The patient stated that all  of her questions have been  answered.  A total of 20 minutes was spent counseling and coordinating  the care for this patient.  Greater than 50% of the time was  spent in direct face-to-face contact.I spoke. ----------------------------------------------------------------------                   Ma Rings, MD Electronically Signed Final Report   04/01/2021 03:35 pm ----------------------------------------------------------------------  [place] MFM OB FOLLOW UP  Result Date: 04/01/2021 ----------------------------------------------------------------------  OBSTETRICS REPORT                       (Signed Final 04/01/2021 03:35 pm) ---------------------------------------------------------------------- Patient Info  ID #:       [id number]                          D.O.B.:  [birth year] (30 yrs)  Name:       Misty Ewing            Visit Date: 04/01/2021 02:06 pm ---------------------------------------------------------------------- Performed By  Attending:        Ma Rings MD         Secondary Phy.:   [facility]                                                             [facility]  Performed By:     Charlyne Petrin     Address:          [address]                    [facility]                                                             [place], [place]  [address]  Referred By:      Mary Sella              Location[facility]         [facility] MD                               [facility]                                                             [facility]                                                             [facility]  Ref. Address:     [address]                    [place], [place]                    [address] ---------------------------------------------------------------------- Orders  #  Description                           Code        Ordered By  1  [place] MFM OB FOLLOW UP                    [phone]    Rosana Hoes  2  [place] MFM UA CORD DOPPLER                76820.02    YU FANG  3  [place] MFM FETAL BPP                      44010.2     Rosana Hoes     W/NONSTRESS ----------------------------------------------------------------------  #  Order #                     Accession #                Episode #  1  [id number]                   [id number]                 [id number]  2  [id number]                   [id number]                 [id number]  3  [id number]                   [id number]                 [id number] ---------------------------------------------------------------------- Indications  Maternal care  for known or suspected poor      O36.5930  fetal growth, third trimester, not applicable or  unspecified IUGR  Poor obstetric history: Previous preterm       O09.219  delivery, antepartum (35 weeks)  Tobacco use complicating pregnancy, third      O99.333  trimester  [redacted] weeks gestation of pregnancy                Z3A.33 ---------------------------------------------------------------------- Fetal Evaluation  Num Of Fetuses:         1  Fetal Heart Rate(bpm):  136  Cardiac Activity:       Observed  Presentation:           Cephalic  Placenta:               Left lateral  P. Cord Insertion:      Previously Visualized  Amniotic Fluid  AFI FV:      Within normal limits  AFI Sum(cm)     %Tile       Largest Pocket(cm)  9.38            13          4.71  RUQ(cm)       RLQ(cm)       LUQ(cm)        LLQ(cm)  4.71          1.74          0              2.93 ---------------------------------------------------------------------- Biophysical Evaluation  Amniotic F.V:   Pocket => 2 cm             F. Tone:        Observed  F. Movement:    Observed                   N.S.T:          Reactive  F. Breathing:   Observed                   Score:          10/10 ---------------------------------------------------------------------- Biometry  BPD:      78.6  mm     G. Age:  31w 4d          6  %    CI:        75.94   %    70 - 86                                                           FL/HC:      20.7   %    19.9 - 21.5  HC:      285.9  mm     G. Age:  31w 3d        < 1  %    HC/AC:      1.04        0.96 - 1.11  AC:      274.9  mm     G. Age:  31w 4d         10  %    FL/BPD:     75.4   %    71 - 87  FL:  59.3  mm     G. Age:  30w 6d        2.2  %    FL/AC:      21.6   %    20 - 24  HUM:      49.7  mm     G. Age:  29w 1d        < 5  %  Est. FW:    1748  gm    3 lb 14 oz     4.6  % ---------------------------------------------------------------------- OB History  Blood Type:   A+  Gravidity:    5         Term:   3        Prem:   1        SAB:   0  TOP:          0       Ectopic:  0        Living: 4 ---------------------------------------------------------------------- Gestational Age  LMP:           33w 2d        Date:  08/11/20                 EDD:   05/18/21  U/S Today:     31w 3d                                        EDD:   05/31/21  Best:          33w 2d     Det. By:  LMP  (08/11/20)          EDD:   05/18/21 ---------------------------------------------------------------------- Anatomy  Cranium:               Appears normal         LVOT:                   Appears normal  Cavum:                 Appears normal         Aortic Arch:            Previously seen  Ventricles:            Previously seen        Ductal Arch:            Previously seen  Choroid Plexus:        Previously seen        Diaphragm:              Appears normal  Cerebellum:            Appears normal         Stomach:                Appears normal, left                                                                        sided  Posterior Fossa:       Previously seen  Abdomen:                Appears normal  Nuchal Fold:           Not applicable (Q000111Q    Abdominal Wall:         Previously seen                         wks GA)  Face:                  Orbits and profile     Cord Vessels:           Previously seen                         previously seen  Lips:                  Previously seen         Kidneys:                Appear normal  Palate:                Previously seen        Bladder:                Appears normal  Thoracic:              Previously seen        Spine:                  Previously seen  Heart:                 Appears normal         Upper Extremities:      Previously seen                         (4CH, axis, and                         situs)  RVOT:                  Previously seen        Lower Extremities:      Previously seen  Other:  Female gender previously seen.Nasal bone and lenses prevoiously          visualized. VC, 3VV and 3VTV previously visualized. Technically          difficult due to fetal position. ---------------------------------------------------------------------- Doppler - Fetal Vessels  Umbilical Artery   S/D     %tile      RI    %tile      PI    %tile            ADFV    RDFV   2.89       68    0.65       72    0.99       72               No      No ---------------------------------------------------------------------- Cervix Uterus Adnexa  Cervix  Not visualized (advanced GA >24wks)  Uterus  Normal shape and size.  Right Ovary  Within normal limits.  Left Ovary  Within normal limits.  Cul De Sac  No free fluid seen.  Adnexa  No adnexal mass visualized. ---------------------------------------------------------------------- Comments  Jenicka Kenerson was seen due to an IUGR fetus. She  reports feeling vigorous fetal movements throughout the day.  She reports frequent feeling painful contractions throughout  the day.  A growth ultrasound performed today shows an EFW of 3  pounds 14 ounces (5th percentile).  There was normal  amniotic fluid with a total AFI of 9.4 cm.  The fetus has grown  close to one pound over the past 3 weeks.  A biophysical profile performed today was 10 out of 10 with a  reactive NST.  Doppler studies of the umbilical arteries performed due to  fetal growth restriction showed a normal S/D ratio of 2.89.  There were no signs of absent or reversed  end-diastolic flow  noted today.  Due to fetal growth restriction, we will continue to follow her  with weekly fetal testing until delivery.  The patient was advised that the goal for her delivery would  be at between 36 to 37 weeks.  Her induction should be  scheduled during this time.  She will return in 1 week for another umbilical artery Doppler  study and BPP.  The patient stated that all of her questions have been  answered.  A total of 20 minutes was spent counseling and coordinating  the care for this patient.  Greater than 50% of the time was  spent in direct face-to-face contact.I spoke. ----------------------------------------------------------------------                   Johnell Comings, MD Electronically Signed Final Report   04/01/2021 03:35 pm ----------------------------------------------------------------------  [place] MFM UA CORD DOPPLER  Result Date: 04/01/2021 ----------------------------------------------------------------------  OBSTETRICS REPORT                       (Signed Final 04/01/2021 03:35 pm) ---------------------------------------------------------------------- Patient Info  ID #:       [id number]                          D.O.B.:  [birth year] (30 yrs)  Name:       Misty Ewing            Visit Date: 04/01/2021 02:06 pm ---------------------------------------------------------------------- Performed By  Attending:        Johnell Comings MD         Secondary Phy.:   [facility]                                                             [facility]  Performed By:     Margaretann Loveless     Address:          [address]                    [place], [place]  [address]  Referred By:      Annice Needy              [facility]         [facility]                    ECK[facility] MD                               [facility] at                                                              [facility]                                                             Women  Ref. Address:     [address] [place]                    [place], [place] ---------------------------------------------------------------------- Orders  #  Description                           Code        Ordered By  1  [place] MFM OB FOLLOW UP                   [phone]    Peterson Ao  2  [place] MFM UA CORD DOPPLER                76820.02    YU FANG  3  [place] MFM FETAL BPP                      [id number]     Peterson Ao     W/NONSTRESS ----------------------------------------------------------------------  #  Order #                     Accession #                Episode #  1  [id number]                   [id number]                 [id number]  2  [id number]                   [id number]                 [id number]  3  [id number]                   [id number]                 [id number] ---------------------------------------------------------------------- Indications  Maternal care  for known or suspected poor      O36.5930  fetal growth, third trimester, not applicable or  unspecified IUGR  Poor obstetric history: Previous preterm       O09.219  delivery, antepartum (35 weeks)  Tobacco use complicating pregnancy, third      O99.333  trimester  [redacted] weeks gestation of pregnancy                Z3A.33 ---------------------------------------------------------------------- Fetal Evaluation  Num Of Fetuses:         1  Fetal Heart Rate(bpm):  136  Cardiac Activity:       Observed  Presentation:           Cephalic  Placenta:               Left lateral  P. Cord Insertion:      Previously Visualized  Amniotic Fluid  AFI FV:      Within normal limits  AFI Sum(cm)     %Tile       Largest Pocket(cm)  9.38            13          4.71  RUQ(cm)       RLQ(cm)       LUQ(cm)        LLQ(cm)  4.71          1.74          0              2.93 ---------------------------------------------------------------------- Biophysical  Evaluation  Amniotic F.V:   Pocket => 2 cm             F. Tone:        Observed  F. Movement:    Observed                   N.S.T:          Reactive  F. Breathing:   Observed                   Score:          10/10 ---------------------------------------------------------------------- Biometry  BPD:      78.6  mm     G. Age:  31w 4d          6  %    CI:        75.94   %    70 - 86                                                          FL/HC:      20.7   %    19.9 - 21.5  HC:      285.9  mm     G. Age:  31w 3d        < 1  %    HC/AC:      1.04        0.96 - 1.11  AC:      274.9  mm     G. Age:  31w 4d         10  %    FL/BPD:     75.4   %    71 - 87  FL:  59.3  mm     G. Age:  30w 6d        2.2  %    FL/AC:      21.6   %    20 - 24  HUM:      49.7  mm     G. Age:  29w 1d        < 5  %  Est. FW:    1748  gm    3 lb 14 oz     4.6  % ---------------------------------------------------------------------- OB History  Blood Type:   A+  Gravidity:    5         Term:   3        Prem:   1        SAB:   0  TOP:          0       Ectopic:  0        Living: 4 ---------------------------------------------------------------------- Gestational Age  LMP:           33w 2d        Date:  08/11/20                 EDD:   05/18/21  U/S Today:     31w 3d                                        EDD:   05/31/21  Best:          33w 2d     Det. By:  LMP  (08/11/20)          EDD:   05/18/21 ---------------------------------------------------------------------- Anatomy  Cranium:               Appears normal         LVOT:                   Appears normal  Cavum:                 Appears normal         Aortic Arch:            Previously seen  Ventricles:            Previously seen        Ductal Arch:            Previously seen  Choroid Plexus:        Previously seen        Diaphragm:              Appears normal  Cerebellum:            Appears normal         Stomach:                Appears normal, left                                                                         sided  Posterior Fossa:       Previously seen  Abdomen:                Appears normal  Nuchal Fold:           Not applicable (Q000111Q    Abdominal Wall:         Previously seen                         wks GA)  Face:                  Orbits and profile     Cord Vessels:           Previously seen                         previously seen  Lips:                  Previously seen        Kidneys:                Appear normal  Palate:                Previously seen        Bladder:                Appears normal  Thoracic:              Previously seen        Spine:                  Previously seen  Heart:                 Appears normal         Upper Extremities:      Previously seen                         (4CH, axis, and                         situs)  RVOT:                  Previously seen        Lower Extremities:      Previously seen  Other:  Female gender previously seen.Nasal bone and lenses prevoiously          visualized. VC, 3VV and 3VTV previously visualized. Technically          difficult due to fetal position. ---------------------------------------------------------------------- Doppler - Fetal Vessels  Umbilical Artery   S/D     %tile      RI    %tile      PI    %tile            ADFV    RDFV   2.89       68    0.65       72    0.99       72               No      No ---------------------------------------------------------------------- Cervix Uterus Adnexa  Cervix  Not visualized (advanced GA >24wks)  Uterus  Normal shape and size.  Right Ovary  Within normal limits.  Left Ovary  Within normal limits.  Cul De Sac  No free fluid seen.  Adnexa  No adnexal mass visualized. ---------------------------------------------------------------------- Comments  Avishi Wetzel was seen due to an IUGR fetus. She  reports feeling vigorous fetal movements throughout the day.  She reports frequent feeling painful contractions throughout  the day.  A growth ultrasound performed today shows an EFW  of 3  pounds 14 ounces (5th percentile).  There was normal  amniotic fluid with a total AFI of 9.4 cm.  The fetus has grown  close to one pound over the past 3 weeks.  A biophysical profile performed today was 10 out of 10 with a  reactive NST.  Doppler studies of the umbilical arteries performed due to  fetal growth restriction showed a normal S/D ratio of 2.89.  There were no signs of absent or reversed end-diastolic flow  noted today.  Due to fetal growth restriction, we will continue to follow her  with weekly fetal testing until delivery.  The patient was advised that the goal for her delivery would  be at between 36 to 37 weeks.  Her induction should be  scheduled during this time.  She will return in 1 week for another umbilical artery Doppler  study and BPP.  The patient stated that all of her questions have been  answered.  A total of 20 minutes was spent counseling and coordinating  the care for this patient.  Greater than 50% of the time was  spent in direct face-to-face contact.I spoke. ----------------------------------------------------------------------                   Johnell Comings, MD Electronically Signed Final Report   04/01/2021 03:35 pm ----------------------------------------------------------------------   Assessment and Plan:  Pregnancy: [id number] at [redacted]w[redacted]d 1. Poor fetal growth affecting management of mother in third trimester, single or unspecified fetus BPP 10/10 today, normal dopplers, EFW 4%.  IOL scheduled at 37 weeks for now, may change if any abnormality noted on scans.    2. Vaginal discharge during pregnancy in third trimester - Cervicovaginal ancillary only done, will follow up results and manage accordingly.  3. History of preterm delivery, currently pregnant Precautions given. Closed cervix on exam today.  4. [redacted] weeks gestation of pregnancy 5. Supervision of high risk pregnancy, antepartum Preterm labor symptoms and general obstetric precautions including but not  limited to vaginal bleeding, contractions, leaking of fluid and fetal movement were reviewed in detail with the patient. Please refer to After Visit Summary for other counseling recommendations.   Return in about 2 weeks (around 04/15/2021) for Pelvic cultures, OFFICE OB VISIT (MD only).  Future Appointments  Date Time Provider [place]  04/09/2021  1:30 PM [facility] NURSE [facility]  04/09/2021  1:45 PM [facility] US6 [facility]  04/09/2021  2:15 PM [facility] NST [facility]  04/17/2021  1:15 PM Clarnce Flock, MD [facility]  04/17/2021  2:30 PM [facility] NURSE [facility]  04/17/2021  2:45 PM [facility] US5 [facility]  04/17/2021  4:00 PM [facility] NST [facility]  04/22/2021  9:30 AM [facility] NURSE [facility]  04/22/2021  9:45 AM [facility] US6 [facility] [place]  04/29/2021 12:00 AM [facility] SCHED ROOM [facility] None    Verita Schneiders, MD

## 2021-04-01 NOTE — Procedures (Signed)
AVAEH EWER 09/19/1990 [redacted]w[redacted]d  [name] Non-Stress Test Interpretation for 04/01/21  Indication: IUGR  Fetal Heart Rate A Mode: External Baseline Rate (A): 130 bpm Variability: Moderate Accelerations: 15 x 15 Decelerations: None Multiple birth?: No  Uterine Activity Mode: Palpation, Toco Contraction Frequency (min): ui Resting Tone Palpated: Relaxed  Interpretation (Fetal Testing) Nonstress Test Interpretation: Reactive Overall Impression: Reassuring for gestational age Comments: Dr. Parke Poisson reviewed tracing

## 2021-04-01 NOTE — Progress Notes (Signed)
MFM Note  Misty Ewing was seen due to an IUGR fetus. She reports feeling vigorous fetal movements throughout the day.  She reports frequent feeling painful contractions throughout the day.  A growth ultrasound performed today shows an EFW of 3 pounds 14 ounces (5th percentile).  There was normal amniotic fluid with a total AFI of 9.4 cm.  The fetus has grown close to one pound over the past 3 weeks.  A biophysical profile performed today was 10 out of 10 with a reactive NST.  Doppler studies of the umbilical arteries performed due to fetal growth restriction showed a normal S/D ratio of 2.89.  There were no signs of absent or reversed end-diastolic flow noted today.  Due to fetal growth restriction, we will continue to follow her with weekly fetal testing until delivery.    The patient was advised that the goal for her delivery would be at between 36 to 37 weeks.  Her induction should be scheduled during this time.    She will return in 1 week for another umbilical artery Doppler study and BPP.  The patient stated that all of her questions have been answered.  A total of 20 minutes was spent counseling and coordinating the care for this patient.  Greater than 50% of the time was spent in direct face-to-face contact.I spoke.

## 2021-04-01 NOTE — Patient Instructions (Addendum)
Return to office for any scheduled appointments. Call the office or go to the MAU at [facility] [facility] if: You begin to have strong, frequent contractions Your water breaks.  Sometimes it is a big gush of fluid, sometimes it is just a trickle that keeps getting your panties wet or running down your legs You have vaginal bleeding.  It is normal to have a small amount of spotting if your cervix was checked.  You do not feel your baby moving like normal.  If you do not, get something to eat and drink and lay down and focus on feeling your baby move.   If your baby is still not moving like normal, you should call the office or go to MAU. Any other obstetric concerns.   Induction scheduled on 04/29/21 at midnight, arrive at 11:45 pm on 04/28/21

## 2021-04-02 LAB — CERVICOVAGINAL ANCILLARY ONLY
Bacterial Vaginitis (gardnerella): NEGATIVE
Candida Glabrata: NEGATIVE
Candida Vaginitis: POSITIVE — AB
Chlamydia: NEGATIVE
Comment: NEGATIVE
Comment: NEGATIVE
Comment: NEGATIVE
Comment: NEGATIVE
Comment: NEGATIVE
Comment: NORMAL
Neisseria Gonorrhea: NEGATIVE
Trichomonas: NEGATIVE

## 2021-04-02 MED ORDER — TERCONAZOLE 0.8 % VA CREA: 1.0000 | TOPICAL_CREAM | Freq: Every day | VAGINAL | 0 refills | Status: DC

## 2021-04-02 NOTE — Addendum Note (Signed)
Addended by: Verita Schneiders A on: 04/02/2021 04:19 PM   Modules accepted: Orders

## 2021-04-09 ENCOUNTER — Encounter: Payer: Self-pay | Admitting: *Deleted

## 2021-04-09 ENCOUNTER — Ambulatory Visit: Payer: Medicaid Other | Admitting: *Deleted

## 2021-04-09 ENCOUNTER — Other Ambulatory Visit: Payer: Self-pay

## 2021-04-09 ENCOUNTER — Other Ambulatory Visit: Payer: Self-pay | Admitting: Obstetrics

## 2021-04-09 ENCOUNTER — Ambulatory Visit: Payer: Medicaid Other | Attending: Obstetrics

## 2021-04-09 VITALS — BP 92/50 | HR 89

## 2021-04-09 DIAGNOSIS — O2343 Unspecified infection of urinary tract in pregnancy, third trimester: Secondary | ICD-10-CM | POA: Diagnosis present

## 2021-04-09 DIAGNOSIS — O09899 Supervision of other high risk pregnancies, unspecified trimester: Secondary | ICD-10-CM | POA: Diagnosis present

## 2021-04-09 DIAGNOSIS — O09213 Supervision of pregnancy with history of pre-term labor, third trimester: Secondary | ICD-10-CM

## 2021-04-09 DIAGNOSIS — Z3A34 34 weeks gestation of pregnancy: Secondary | ICD-10-CM

## 2021-04-09 DIAGNOSIS — O0990 Supervision of high risk pregnancy, unspecified, unspecified trimester: Secondary | ICD-10-CM | POA: Insufficient documentation

## 2021-04-09 DIAGNOSIS — O365930 Maternal care for other known or suspected poor fetal growth, third trimester, not applicable or unspecified: Secondary | ICD-10-CM

## 2021-04-09 NOTE — Procedures (Signed)
Misty Ewing 06/14/1990 [redacted]w[redacted]d  [name] Non-Stress Test Interpretation for 04/09/21  Indication: IUGR  Fetal Heart Rate A Mode: External Baseline Rate (A): 130 bpm Variability: Moderate Accelerations: 15 x 15 Decelerations: None Multiple birth?: No  Uterine Activity Mode: Palpation, Toco Contraction Frequency (min): UI Contraction Quality: Mild Resting Tone Palpated: Relaxed Resting Time: Adequate  Interpretation (Fetal Testing) Nonstress Test Interpretation: Reactive Comments: Dr. Annamaria Boots reviewed tracing.

## 2021-04-15 ENCOUNTER — Telehealth (HOSPITAL_COMMUNITY): Payer: Self-pay | Admitting: *Deleted

## 2021-04-15 NOTE — Telephone Encounter (Signed)
Preadmission screen  

## 2021-04-16 ENCOUNTER — Encounter (HOSPITAL_COMMUNITY): Payer: Self-pay

## 2021-04-16 ENCOUNTER — Telehealth (HOSPITAL_COMMUNITY): Payer: Self-pay | Admitting: *Deleted

## 2021-04-16 ENCOUNTER — Other Ambulatory Visit: Payer: Self-pay | Admitting: Family Medicine

## 2021-04-16 ENCOUNTER — Encounter (HOSPITAL_COMMUNITY): Payer: Self-pay | Admitting: *Deleted

## 2021-04-16 NOTE — Telephone Encounter (Signed)
Preadmission screen  

## 2021-04-17 ENCOUNTER — Ambulatory Visit (INDEPENDENT_AMBULATORY_CARE_PROVIDER_SITE_OTHER): Payer: Medicaid Other | Admitting: Family Medicine

## 2021-04-17 ENCOUNTER — Other Ambulatory Visit (HOSPITAL_COMMUNITY)
Admission: RE | Admit: 2021-04-17 | Discharge: 2021-04-17 | Disposition: A | Discharge status: Home / Self Care | Payer: Medicaid Other | Source: Ambulatory Visit | Attending: Family Medicine | Admitting: Family Medicine

## 2021-04-17 ENCOUNTER — Ambulatory Visit: Payer: Medicaid Other | Admitting: *Deleted

## 2021-04-17 ENCOUNTER — Other Ambulatory Visit: Payer: Self-pay

## 2021-04-17 ENCOUNTER — Other Ambulatory Visit: Payer: Self-pay | Admitting: Obstetrics

## 2021-04-17 ENCOUNTER — Encounter: Payer: Self-pay | Admitting: Family Medicine

## 2021-04-17 ENCOUNTER — Ambulatory Visit: Payer: Medicaid Other | Attending: Obstetrics

## 2021-04-17 VITALS — BP 94/61 | HR 87 | Wt 129.1 lb

## 2021-04-17 VITALS — BP 102/59 | HR 74

## 2021-04-17 DIAGNOSIS — O09899 Supervision of other high risk pregnancies, unspecified trimester: Secondary | ICD-10-CM | POA: Insufficient documentation

## 2021-04-17 DIAGNOSIS — O2343 Unspecified infection of urinary tract in pregnancy, third trimester: Secondary | ICD-10-CM | POA: Insufficient documentation

## 2021-04-17 DIAGNOSIS — O0990 Supervision of high risk pregnancy, unspecified, unspecified trimester: Secondary | ICD-10-CM | POA: Diagnosis not present

## 2021-04-17 DIAGNOSIS — Z3A35 35 weeks gestation of pregnancy: Secondary | ICD-10-CM | POA: Diagnosis not present

## 2021-04-17 DIAGNOSIS — O365930 Maternal care for other known or suspected poor fetal growth, third trimester, not applicable or unspecified: Secondary | ICD-10-CM

## 2021-04-17 DIAGNOSIS — O09213 Supervision of pregnancy with history of pre-term labor, third trimester: Secondary | ICD-10-CM | POA: Diagnosis not present

## 2021-04-17 DIAGNOSIS — R87611 Atypical squamous cells cannot exclude high grade squamous intraepithelial lesion on cytologic smear of cervix (ASC-H): Secondary | ICD-10-CM

## 2021-04-17 NOTE — Patient Instructions (Signed)

## 2021-04-17 NOTE — Procedures (Signed)
Misty Ewing [birth year] [redacted]w[redacted]d  [name] Non-Stress Test Interpretation for 04/17/21  Indication: IUGR  Fetal Heart Rate A Mode: External Baseline Rate (A): 150 bpm Variability: Moderate Accelerations: 15 x 15 Decelerations: Variable Multiple birth?: No  Uterine Activity Mode: Palpation, Toco Contraction Frequency (min): none Resting Tone Palpated: Relaxed  Interpretation (Fetal Testing) Nonstress Test Interpretation: Reactive Overall Impression: Reassuring for gestational age Comments: Dr. Parke Poisson reviewed tracing

## 2021-04-17 NOTE — Progress Notes (Signed)
Pt had some vaginal pink spotting this morning and says she has this intermittantly.

## 2021-04-17 NOTE — Progress Notes (Signed)
° °  Subjective:  Misty Ewing is a 31 y.o. [id number] at [redacted]w[redacted]d being seen today for ongoing prenatal care.  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  Patient reports no complaints.  Contractions: Irritability. Vag. Bleeding: None.  Movement: Present. Denies leaking of fluid.   The following portions of the patient's history were reviewed and updated as appropriate: allergies, current medications, past family history, past medical history, past social history, past surgical history and problem list. Problem list updated.  Objective:   Vitals:   04/17/21 1318  BP: 94/61  Pulse: 87  Weight: 129 lb 1.6 oz (58.6 kg)    Fetal Status: Fetal Heart Rate (bpm): 131   Movement: Present  Presentation: Vertex  General:  Alert, oriented and cooperative. Patient is in no acute distress.  Skin: Skin is warm and dry. No rash noted.   Cardiovascular: Normal heart rate noted  Respiratory: Normal respiratory effort, no problems with respiration noted  Abdomen: Soft, gravid, appropriate for gestational age. Pain/Pressure: Present     Pelvic: Vag. Bleeding: None     Cervical exam deferred Dilation: 2.5 Effacement (%): Thick Station: -2  Extremities: Normal range of motion.  Edema: None  Mental Status: Normal mood and affect. Normal behavior. Normal judgment and thought content.   Urinalysis:      Assessment and Plan:  Pregnancy: [id number] at [redacted]w[redacted]d  1. Supervision of high risk pregnancy, antepartum BP and FHR normal Swabs today -  GC/Chlamydia probe amp ([place])not at [facility] - Strep Gp B Culture+Rflx  2. Poor fetal growth affecting management of mother in third trimester, single or unspecified fetus EFW 4% last growth [place] Following w MFM, normal dopplers to date Scheduled for IOL on 04/29/2021  3. 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 Ilda Basset  4. History of preterm delivery, currently pregnant First delivery at 36 weeks, three subsequent term deliveries  Preterm labor symptoms and general obstetric precautions including but not limited to vaginal bleeding, contractions, leaking of fluid and fetal movement were reviewed in detail with the patient. Please refer to After Visit Summary for other counseling recommendations.  Return in 1 week (on 04/24/2021) for [facility], ob visit, needs MD.   Clarnce Flock, MD

## 2021-04-18 LAB — GC/CHLAMYDIA PROBE AMP (~~LOC~~) NOT AT ARMC
Chlamydia: NEGATIVE
Comment: NEGATIVE
Comment: NORMAL
Neisseria Gonorrhea: NEGATIVE

## 2021-04-19 ENCOUNTER — Telehealth: Payer: Self-pay

## 2021-04-19 NOTE — Telephone Encounter (Signed)
Front office requested to call pt because she has been spotting red and brown blood for 3 days only in her underwear but she doesn't see any when she wipes and it is freaking her out.  Called pt and pt states that she is having brownish with mucous bleeding in her underwear.  Pt states that she does not wear a pad.  Pt states that she has reported the spotting to two providers who informed her that her spotting was normal. Per chart review pt had a cervical exam on 04/17/21.  I explained to the pt that it is normal to also have vaginal spotting after a cervical exam.  I advised pt that if her bleeding picks up to please go to MAU.  I also let the pt know that because her bleeding is brownish it usually represents that her bleeding is ending.  I informed pt that if her bleeding will be evaluated at her visit on 04/24/21.  Pt verbalized understanding.   Misty Ewing  04/19/21

## 2021-04-21 LAB — STREP GP B CULTURE+RFLX: Strep Gp B Culture+Rflx: NEGATIVE

## 2021-04-22 ENCOUNTER — Other Ambulatory Visit: Payer: Self-pay | Admitting: Obstetrics

## 2021-04-22 ENCOUNTER — Ambulatory Visit: Payer: Medicaid Other | Admitting: *Deleted

## 2021-04-22 ENCOUNTER — Other Ambulatory Visit: Payer: Self-pay

## 2021-04-22 ENCOUNTER — Ambulatory Visit: Payer: Medicaid Other | Attending: Obstetrics and Gynecology

## 2021-04-22 ENCOUNTER — Ambulatory Visit (INDEPENDENT_AMBULATORY_CARE_PROVIDER_SITE_OTHER): Payer: Medicaid Other | Admitting: Family Medicine

## 2021-04-22 VITALS — BP 104/61 | HR 83

## 2021-04-22 VITALS — BP 112/69 | HR 96 | Wt 126.1 lb

## 2021-04-22 DIAGNOSIS — O09899 Supervision of other high risk pregnancies, unspecified trimester: Secondary | ICD-10-CM | POA: Diagnosis present

## 2021-04-22 DIAGNOSIS — Z3A36 36 weeks gestation of pregnancy: Secondary | ICD-10-CM

## 2021-04-22 DIAGNOSIS — O0990 Supervision of high risk pregnancy, unspecified, unspecified trimester: Secondary | ICD-10-CM

## 2021-04-22 DIAGNOSIS — O365930 Maternal care for other known or suspected poor fetal growth, third trimester, not applicable or unspecified: Secondary | ICD-10-CM

## 2021-04-22 DIAGNOSIS — O99333 Smoking (tobacco) complicating pregnancy, third trimester: Secondary | ICD-10-CM | POA: Insufficient documentation

## 2021-04-22 NOTE — Progress Notes (Signed)
° °  PRENATAL VISIT NOTE  Subjective:  Misty Ewing is a 31 y.o. [id number] at [redacted]w[redacted]d being seen today for ongoing prenatal care.  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  Patient reports  vaginal spotting for 1 week, contraction for 1 week .  Contractions: Irregular. Vag. Bleeding: Scant.  Movement: Present. Denies leaking of fluid.   The following portions of the patient's history were reviewed and updated as appropriate: allergies, current medications, past family history, past medical history, past social history, past surgical history and problem list.   Objective:   Vitals:   04/22/21 1138  BP: 112/69  Pulse: 96  Weight: 126 lb 1.6 oz (57.2 kg)    Fetal Status: Fetal Heart Rate (bpm): 120 Fundal Height: 35 cm Movement: Present  Presentation: Vertex  General:  Alert, oriented and cooperative. Patient is in no acute distress.  Skin: Skin is warm and dry. No rash noted.   Cardiovascular: Normal heart rate noted  Respiratory: Normal respiratory effort, no problems with respiration noted  Abdomen: Soft, gravid, appropriate for gestational age.  Pain/Pressure: Present     Pelvic: Cervical exam performed in the presence of a chaperone Dilation: 2.5 Effacement (%): Thick Station: -2  Extremities: Normal range of motion.  Edema: None  Mental Status: Normal mood and affect. Normal behavior. Normal judgment and thought content.   Assessment and Plan:  Pregnancy: [id number] at [redacted]w[redacted]d  1. Supervision of high risk pregnancy,  antepartum Has vaginal swabs last visit-- GBS negative, GC/CT negative Knows about IOL mon 2/20  Having fundal pain- tender to palpation, unsure of origin-- uterus is not tense and I have a low suspicion of abruption which would cause this time of abdominal pain. Given guidance to go to the hospital if this worsens.   Reviewed when to go to the hospital-- patient had two strong contractions while we were talking. Cervix is anterior and soft. Reviewed if she has 10 strong contractions in an hour she should go to hospital.  Unable to see MFM report today but per patient it was reassuring.  2. Poor fetal growth affecting management of mother in third trimester, single or unspecified fetus IOL at 12 wks scheduled  3. History of preterm delivery, currently pregnant Having contractions today (last check was 2.5cm and is 2.5 today- anterior)-- does seem to be in prodromal labor but def not active labor here in the office. Discussed labor precautions in detail    Preterm labor symptoms and general obstetric precautions including but not limited to vaginal bleeding, contractions, leaking of fluid and fetal movement were reviewed in detail with the patient. Please refer to After Visit Summary for other counseling recommendations.   Return in about 2 days (around 04/24/2021) for scheduled visit.  Future Appointments  Date Time Provider [place]  04/24/2021  3:15 PM Clarnce Flock, MD [facility]  04/29/2021 12:00 AM [facility] SCHED ROOM [facility] None    Caren Macadam, MD

## 2021-04-22 NOTE — Procedures (Signed)
Misty Ewing 03/11/1990 [redacted]w[redacted]d  [name] Non-Stress Test Interpretation for 04/22/21  Indication: IUGR  Fetal Heart Rate A Mode: External Baseline Rate (A): 110 bpm Variability: Moderate Accelerations: 15 x 15 Decelerations:  (One section with FHR 95-105)  Uterine Activity Mode: Palpation, Toco Contraction Frequency (min): 1 UC w/UI Contraction Duration (sec): 50 and 10-20 Contraction Quality: Mild Resting Tone Palpated: Relaxed Resting Time: Adequate  Interpretation (Fetal Testing) Nonstress Test Interpretation: Reactive Comments: Dr. Judeth Cornfield reviewed tracing.

## 2021-04-24 ENCOUNTER — Other Ambulatory Visit: Payer: Self-pay

## 2021-04-24 ENCOUNTER — Ambulatory Visit (INDEPENDENT_AMBULATORY_CARE_PROVIDER_SITE_OTHER): Payer: Medicaid Other | Admitting: Family Medicine

## 2021-04-24 ENCOUNTER — Encounter: Payer: Self-pay | Admitting: Family Medicine

## 2021-04-24 VITALS — BP 101/64 | HR 90 | Wt 129.6 lb

## 2021-04-24 DIAGNOSIS — O0990 Supervision of high risk pregnancy, unspecified, unspecified trimester: Secondary | ICD-10-CM

## 2021-04-24 DIAGNOSIS — O09899 Supervision of other high risk pregnancies, unspecified trimester: Secondary | ICD-10-CM

## 2021-04-24 DIAGNOSIS — O365930 Maternal care for other known or suspected poor fetal growth, third trimester, not applicable or unspecified: Secondary | ICD-10-CM

## 2021-04-24 DIAGNOSIS — R87611 Atypical squamous cells cannot exclude high grade squamous intraepithelial lesion on cytologic smear of cervix (ASC-H): Secondary | ICD-10-CM

## 2021-04-24 NOTE — Progress Notes (Signed)
° °  Subjective:  Misty Ewing is a 31 y.o. [id number] at [redacted]w[redacted]d being seen today for ongoing prenatal care.  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  Patient reports no complaints.  Contractions: Irritability. Vag. Bleeding: Scant.  Movement: Present. Denies leaking of fluid.   The following portions of the patient's history were reviewed and updated as appropriate: allergies, current medications, past family history, past medical history, past social history, past surgical history and problem list. Problem list updated.  Objective:   Vitals:   04/24/21 1516  BP: 101/64  Pulse: 90  Weight: 129 lb 9.6 oz (58.8 kg)    Fetal Status: Fetal Heart Rate (bpm): 145   Movement: Present     General:  Alert, oriented and cooperative. Patient is in no acute distress.  Skin: Skin is warm and dry. No rash noted.   Cardiovascular: Normal heart rate noted  Respiratory: Normal respiratory effort, no problems with respiration noted  Abdomen: Soft, gravid, appropriate for gestational age. Pain/Pressure: Present     Pelvic: Vag. Bleeding: Scant     Cervical exam deferred        Extremities: Normal range of motion.  Edema: None  Mental Status: Normal mood and affect. Normal behavior. Normal judgment and thought content.   Urinalysis:      Assessment and Plan:  Pregnancy: [id number] at [redacted]w[redacted]d  1. Supervision of high risk pregnancy, antepartum BP and FHR normal  2. Poor fetal growth affecting management of mother in third trimester, single  or unspecified fetus IOL scheduled for 04/29/2021 Normal dopplers and BPP to date  3. History of preterm delivery, currently pregnant [redacted] weeks first del Three subsequent term deliveries  4. Atypical squamous cells cannot exclude high grade squamous intraepithelial lesion on cytologic smear of cervix (ASC-H) Normal colpo earlier in pregnancy Repeat pap 01/2022 per [place]  Preterm labor symptoms and general obstetric precautions including but not limited to vaginal bleeding, contractions, leaking of fluid and fetal movement were reviewed in detail with the patient. Please refer to After Visit Summary for other counseling recommendations.  Return in 7 weeks (on 06/12/2021) for PP check.   Clarnce Flock, MD

## 2021-04-24 NOTE — Patient Instructions (Signed)

## 2021-04-28 ENCOUNTER — Inpatient Hospital Stay (HOSPITAL_COMMUNITY)
Admission: AD | Admit: 2021-04-28 | Discharge: 2021-05-01 | DRG: 807 | Disposition: A | Discharge status: Home / Self Care | Payer: Medicaid Other | Attending: Obstetrics & Gynecology | Admitting: Obstetrics & Gynecology

## 2021-04-28 ENCOUNTER — Other Ambulatory Visit: Payer: Self-pay

## 2021-04-28 DIAGNOSIS — Z87891 Personal history of nicotine dependence: Secondary | ICD-10-CM

## 2021-04-28 DIAGNOSIS — G43109 Migraine with aura, not intractable, without status migrainosus: Secondary | ICD-10-CM

## 2021-04-28 DIAGNOSIS — Z8751 Personal history of pre-term labor: Secondary | ICD-10-CM

## 2021-04-28 DIAGNOSIS — O0990 Supervision of high risk pregnancy, unspecified, unspecified trimester: Secondary | ICD-10-CM

## 2021-04-28 DIAGNOSIS — O09899 Supervision of other high risk pregnancies, unspecified trimester: Secondary | ICD-10-CM

## 2021-04-28 DIAGNOSIS — O365930 Maternal care for other known or suspected poor fetal growth, third trimester, not applicable or unspecified: Principal | ICD-10-CM | POA: Diagnosis present

## 2021-04-28 DIAGNOSIS — Z20822 Contact with and (suspected) exposure to covid-19: Secondary | ICD-10-CM | POA: Diagnosis present

## 2021-04-28 DIAGNOSIS — Z3A37 37 weeks gestation of pregnancy: Secondary | ICD-10-CM | POA: Diagnosis not present

## 2021-04-28 DIAGNOSIS — Z880 Allergy status to penicillin: Secondary | ICD-10-CM | POA: Diagnosis not present

## 2021-04-28 DIAGNOSIS — Z8759 Personal history of other complications of pregnancy, childbirth and the puerperium: Secondary | ICD-10-CM | POA: Diagnosis present

## 2021-04-29 ENCOUNTER — Inpatient Hospital Stay (HOSPITAL_COMMUNITY): Payer: Medicaid Other

## 2021-04-29 ENCOUNTER — Encounter (HOSPITAL_COMMUNITY): Payer: Self-pay | Admitting: Obstetrics & Gynecology

## 2021-04-29 ENCOUNTER — Other Ambulatory Visit: Payer: Self-pay

## 2021-04-29 ENCOUNTER — Inpatient Hospital Stay (HOSPITAL_COMMUNITY): Payer: Medicaid Other | Admitting: Anesthesiology

## 2021-04-29 DIAGNOSIS — O365930 Maternal care for other known or suspected poor fetal growth, third trimester, not applicable or unspecified: Secondary | ICD-10-CM

## 2021-04-29 DIAGNOSIS — Z8759 Personal history of other complications of pregnancy, childbirth and the puerperium: Secondary | ICD-10-CM | POA: Diagnosis present

## 2021-04-29 DIAGNOSIS — Z3A37 37 weeks gestation of pregnancy: Secondary | ICD-10-CM

## 2021-04-29 LAB — CBC
HCT: 32.2 % — ABNORMAL LOW (ref 36.0–46.0)
Hemoglobin: 10.9 g/dL — ABNORMAL LOW (ref 12.0–15.0)
MCH: 31.9 pg (ref 26.0–34.0)
MCHC: 33.9 g/dL (ref 30.0–36.0)
MCV: 94.2 fL (ref 80.0–100.0)
Platelets: 277 10*3/uL (ref 150–400)
RBC: 3.42 MIL/uL — ABNORMAL LOW (ref 3.87–5.11)
RDW: 13.2 % (ref 11.5–15.5)
WBC: 9 10*3/uL (ref 4.0–10.5)
nRBC: 0 % (ref 0.0–0.2)

## 2021-04-29 LAB — RESP PANEL BY RT-PCR (FLU A&B, COVID) ARPGX2
Influenza A by PCR: NEGATIVE
Influenza B by PCR: NEGATIVE
SARS Coronavirus 2 by RT PCR: NEGATIVE

## 2021-04-29 LAB — TYPE AND SCREEN
ABO/RH(D): A POS
Antibody Screen: NEGATIVE

## 2021-04-29 LAB — RPR: RPR Ser Ql: NONREACTIVE

## 2021-04-29 MED ORDER — OXYTOCIN BOLUS FROM INFUSION: 333.0000 mL | Freq: Once | INTRAVENOUS | Status: DC

## 2021-04-29 MED ORDER — EPHEDRINE 5 MG/ML INJ: 10.0000 mg | INTRAVENOUS | Status: DC | PRN

## 2021-04-29 MED ORDER — LIDOCAINE HCL (PF) 1 % IJ SOLN
30.0000 mL | INTRAMUSCULAR | Status: DC | PRN
Start: 2021-04-29 — End: 2021-04-29

## 2021-04-29 MED ORDER — WITCH HAZEL-GLYCERIN EX PADS: 1.0000 "application " | MEDICATED_PAD | CUTANEOUS | Status: DC | PRN

## 2021-04-29 MED ORDER — ACETAMINOPHEN 325 MG PO TABS: 650.0000 mg | ORAL_TABLET | ORAL | Status: DC | PRN

## 2021-04-29 MED ORDER — LACTATED RINGERS IV SOLN: INTRAVENOUS | Status: DC

## 2021-04-29 MED ORDER — ACETAMINOPHEN 325 MG PO TABS
650.0000 mg | ORAL_TABLET | ORAL | Status: DC | PRN
Start: 2021-04-29 — End: 2021-05-01
  Administered 2021-04-29 – 2021-05-01 (×5): 650 mg via ORAL
  Filled 2021-04-29 (×6): qty 2

## 2021-04-29 MED ORDER — FENTANYL CITRATE (PF) 100 MCG/2ML IJ SOLN: 50.0000 ug | INTRAMUSCULAR | Status: DC | PRN

## 2021-04-29 MED ORDER — ONDANSETRON HCL 4 MG/2ML IJ SOLN: 4.0000 mg | INTRAMUSCULAR | Status: DC | PRN

## 2021-04-29 MED ORDER — PRENATAL MULTIVITAMIN CH
1.0000 | ORAL_TABLET | Freq: Every day | ORAL | Status: DC
  Administered 2021-05-01: 1 via ORAL
  Filled 2021-04-29: qty 1

## 2021-04-29 MED ORDER — LACTATED RINGERS IV SOLN
500.0000 mL | INTRAVENOUS | Status: DC | PRN
  Administered 2021-04-29: 500 mL via INTRAVENOUS

## 2021-04-29 MED ORDER — DIBUCAINE (PERIANAL) 1 % EX OINT: 1.0000 "application " | TOPICAL_OINTMENT | CUTANEOUS | Status: DC | PRN

## 2021-04-29 MED ORDER — FLEET ENEMA 7-19 GM/118ML RE ENEM: 1.0000 | ENEMA | Freq: Every day | RECTAL | Status: DC | PRN

## 2021-04-29 MED ORDER — OXYTOCIN-SODIUM CHLORIDE 30-0.9 UT/500ML-% IV SOLN
1.0000 m[IU]/min | INTRAVENOUS | Status: DC
  Administered 2021-04-29: 2 m[IU]/min via INTRAVENOUS
  Filled 2021-04-29: qty 500

## 2021-04-29 MED ORDER — ZOLPIDEM TARTRATE 5 MG PO TABS: 5.0000 mg | ORAL_TABLET | Freq: Every evening | ORAL | Status: DC | PRN

## 2021-04-29 MED ORDER — SIMETHICONE 80 MG PO CHEW: 80.0000 mg | CHEWABLE_TABLET | ORAL | Status: DC | PRN

## 2021-04-29 MED ORDER — LIDOCAINE HCL (PF) 1 % IJ SOLN
INTRAMUSCULAR | Status: DC | PRN
  Administered 2021-04-29: 9 mL via EPIDURAL

## 2021-04-29 MED ORDER — MISOPROSTOL 25 MCG QUARTER TABLET
25.0000 ug | ORAL_TABLET | ORAL | Status: DC | PRN
Start: 2021-04-29 — End: 2021-04-29

## 2021-04-29 MED ORDER — MEDROXYPROGESTERONE ACETATE 150 MG/ML IM SUSP
150.0000 mg | INTRAMUSCULAR | Status: AC | PRN
  Administered 2021-04-30: 150 mg via INTRAMUSCULAR
  Filled 2021-04-29: qty 1

## 2021-04-29 MED ORDER — PHENYLEPHRINE 40 MCG/ML (10ML) SYRINGE FOR IV PUSH (FOR BLOOD PRESSURE SUPPORT): 80.0000 ug | PREFILLED_SYRINGE | INTRAVENOUS | Status: DC | PRN

## 2021-04-29 MED ORDER — TERBUTALINE SULFATE 1 MG/ML IJ SOLN
0.2500 mg | Freq: Once | INTRAMUSCULAR | Status: DC | PRN
Start: 2021-04-29 — End: 2021-04-29

## 2021-04-29 MED ORDER — SOD CITRATE-CITRIC ACID 500-334 MG/5ML PO SOLN: 30.0000 mL | ORAL | Status: DC | PRN

## 2021-04-29 MED ORDER — FENTANYL-BUPIVACAINE-NACL 0.5-0.125-0.9 MG/250ML-% EP SOLN
12.0000 mL/h | EPIDURAL | Status: DC | PRN
  Administered 2021-04-29: 12 mL/h via EPIDURAL
  Filled 2021-04-29: qty 250

## 2021-04-29 MED ORDER — ONDANSETRON HCL 4 MG/2ML IJ SOLN
4.0000 mg | Freq: Four times a day (QID) | INTRAMUSCULAR | Status: DC | PRN
Start: 2021-04-29 — End: 2021-04-29

## 2021-04-29 MED ORDER — COCONUT OIL OIL: 1.0000 "application " | TOPICAL_OIL | Status: DC | PRN

## 2021-04-29 MED ORDER — OXYTOCIN-SODIUM CHLORIDE 30-0.9 UT/500ML-% IV SOLN: 2.5000 [IU]/h | INTRAVENOUS | Status: DC

## 2021-04-29 MED ORDER — OXYCODONE-ACETAMINOPHEN 5-325 MG PO TABS: 2.0000 | ORAL_TABLET | ORAL | Status: DC | PRN

## 2021-04-29 MED ORDER — OXYCODONE-ACETAMINOPHEN 5-325 MG PO TABS: 1.0000 | ORAL_TABLET | ORAL | Status: DC | PRN

## 2021-04-29 MED ORDER — DIPHENHYDRAMINE HCL 25 MG PO CAPS: 25.0000 mg | ORAL_CAPSULE | Freq: Four times a day (QID) | ORAL | Status: DC | PRN

## 2021-04-29 MED ORDER — TETANUS-DIPHTH-ACELL PERTUSSIS 5-2.5-18.5 LF-MCG/0.5 IM SUSY: 0.5000 mL | PREFILLED_SYRINGE | Freq: Once | INTRAMUSCULAR | Status: DC

## 2021-04-29 MED ORDER — HYDROXYZINE HCL 50 MG PO TABS
50.0000 mg | ORAL_TABLET | Freq: Four times a day (QID) | ORAL | Status: DC | PRN
Start: 2021-04-29 — End: 2021-04-29

## 2021-04-29 MED ORDER — LACTATED RINGERS IV SOLN: 500.0000 mL | Freq: Once | INTRAVENOUS | Status: DC

## 2021-04-29 MED ORDER — SENNOSIDES-DOCUSATE SODIUM 8.6-50 MG PO TABS
2.0000 | ORAL_TABLET | Freq: Every day | ORAL | Status: DC
  Filled 2021-04-29 (×2): qty 2

## 2021-04-29 MED ORDER — PHENYLEPHRINE 40 MCG/ML (10ML) SYRINGE FOR IV PUSH (FOR BLOOD PRESSURE SUPPORT)
80.0000 ug | PREFILLED_SYRINGE | INTRAVENOUS | Status: DC | PRN
  Filled 2021-04-29: qty 10

## 2021-04-29 MED ORDER — ONDANSETRON HCL 4 MG PO TABS: 4.0000 mg | ORAL_TABLET | ORAL | Status: DC | PRN

## 2021-04-29 MED ORDER — BENZOCAINE-MENTHOL 20-0.5 % EX AERO: 1.0000 "application " | INHALATION_SPRAY | CUTANEOUS | Status: DC | PRN

## 2021-04-29 MED ORDER — DIPHENHYDRAMINE HCL 50 MG/ML IJ SOLN: 12.5000 mg | INTRAMUSCULAR | Status: DC | PRN

## 2021-04-29 MED ORDER — IBUPROFEN 600 MG PO TABS
600.0000 mg | ORAL_TABLET | Freq: Four times a day (QID) | ORAL | Status: DC
  Administered 2021-04-29 – 2021-05-01 (×8): 600 mg via ORAL
  Filled 2021-04-29 (×8): qty 1

## 2021-04-29 NOTE — H&P (Signed)
OBSTETRIC ADMISSION HISTORY AND PHYSICAL  Misty Ewing is a 31 y.o. female [phone] with IUP at [redacted]w[redacted]d by LMP presenting for IOL d/t FGR. She reports +FM. No contractions, LOF, no VB, no blurry vision, headaches or peripheral edema, and RUQ pain. She plans on bottle feeding. She request Depo for birth control. She received her prenatal care at  [facility]    Dating: By LMP --->  Estimated Date of Delivery: 05/20/21  Sono:    @[redacted]w[redacted]d , CWD, normal anatomy, breech presentation, left lateral placenta, 280g, 41% EFW   Prenatal History/Complications:  - Fetal growth restriction - Large fetal stomach - Hx preterm delivery - Hx of migraines   Past Medical History: Past Medical History:  Diagnosis Date   Depression    Headache    UTI (urinary tract infection) during pregnancy    Vaginal Pap smear, abnormal     Past Surgical History: Past Surgical History:  Procedure Laterality Date   TOOTH EXTRACTION  2020   WISDOM TOOTH EXTRACTION  2020    Obstetrical History: OB History     Gravida  5   Para  4   Term  3   Preterm  1   AB  0   Living  4      SAB  0   IAB  0   Ectopic  0   Multiple  0   Live Births  4           Social History Social History   Socioeconomic History   Marital status: Single    Spouse name: Not on file   Number of children: Not on file   Years of education: Not on file   Highest education level: Not on file  Occupational History   Not on file  Tobacco Use   Smoking status: Former    Types: Cigarettes    Quit date: 02/12/2021    Years since quitting: 0.2   Smokeless tobacco: Never  Vaping Use   Vaping Use: Never used  Substance and Sexual Activity   Alcohol use: Not Currently    Comment: stopped in 2021   Drug use: Not Currently    Types: Marijuana    Comment: stopped 2011   Sexual activity: Yes    Birth control/protection: None  Other Topics Concern   Not on file  Social History Narrative   Not on file   Social  Determinants of Health   Financial Resource Strain: Not on file  Food Insecurity: Food Insecurity Present   Worried About Running Out of Food in the Last Year: Sometimes true   Ran Out of Food in the Last Year: Sometimes true  Transportation Needs: No Transportation Needs   Lack of Transportation (Medical): No   Lack of Transportation (Non-Medical): No  Physical Activity: Not on file  Stress: Not on file  Social Connections: Not on file    Family History: Family History  Problem Relation Age of Onset   Stroke Mother    Hypertension Mother    Bipolar disorder Mother    Schizophrenia Mother    Cancer Maternal Aunt    Anesthesia problems Neg Hx    Hypotension Neg Hx    Malignant hyperthermia Neg Hx    Pseudochol deficiency Neg Hx     Allergies: Allergies  Allergen Reactions   Bee Venom Swelling    Unknown type of swelling   Penicillins Swelling    Has patient had a PCN reaction causing immediate rash, facial/tongue/throat swelling, SOB  or lightheadedness with hypotension: No Has patient had a PCN reaction causing severe rash involving mucus membranes or skin necrosis: No Has patient had a PCN reaction that required hospitalization No Has patient had a PCN reaction occurring within the last 10 years: No (childhood reaction) If all of the above answers are "NO", then may proceed with Cephalosporin use.    Medications Prior to Admission  Medication Sig Dispense Refill Last Dose   acetaminophen (TYLENOL) 325 MG tablet Take 2 tablets (650 mg total) by mouth every 6 (six) hours as needed. 100 tablet 1    Prenatal Vit-Fe Fumarate-FA (PREPLUS) 27-1 MG TABS Take 1 tablet by mouth daily. (Patient not taking: Reported on 04/01/2021) 30 tablet 6    Review of Systems   All systems reviewed and negative except as stated in HPI  Blood pressure (!) 100/59, pulse 94, temperature 97.7 F (36.5 C), temperature source Oral, resp. rate 16, last menstrual period 08/11/2020. General  appearance: alert and no distress Lungs: effort normal Heart: regular rate  Abdomen: soft, non-tender, gravid Pelvic: adequate Extremities: no sign of DVT Presentation: cephalic Fetal monitoring: 130 bpm, moderate variability, +15x15 accels, no decels Uterine activity: Q 2-4 mins Dilation: 3 Effacement (%): 50 Station: -3 Exam by:: Lysle Dingwall, CNM  Prenatal labs: ABO, Rh: --/--/A POS (02/20 0057) Antibody: NEG (02/20 0057) Rubella: 19.20 (09/07 1448) RPR: Non Reactive (12/30 0853)  HBsAg: Negative (09/07 1448)  HIV: Non Reactive (12/30 0853)  GBS: Negative/-- (02/08 1346)  2 hr Glucola: 71/93/87 Genetic screening: LR NIPS, AFP negative Anatomy US: normal  Prenatal Transfer Tool  Maternal Diabetes: No Genetic Screening: Normal Maternal Ultrasounds/Referrals: IUGR, large fetal stomach Fetal Ultrasounds or other Referrals:  None Maternal Substance Abuse:  No Significant Maternal Medications:  None Significant Maternal Lab Results: Group B Strep negative  Results for orders placed or performed during the hospital encounter of 04/28/21 (from the past 24 hour(s))  Type and screen   Collection Time: 04/29/21 12:57 AM  Result Value Ref Range   ABO/RH(D) A POS    Antibody Screen NEG    Sample Expiration      05/02/2021,2359 Performed at [facility], [address], [place], [place] [address]   Resp Panel by RT-PCR (Flu A&B, Covid) Nasopharyngeal Swab   Collection Time: 04/29/21 12:59 AM   Specimen: Nasopharyngeal Swab; Nasopharyngeal(NP) swabs in vial transport medium  Result Value Ref Range   SARS Coronavirus 2 by RT PCR NEGATIVE NEGATIVE   Influenza A by PCR NEGATIVE NEGATIVE   Influenza B by PCR NEGATIVE NEGATIVE  CBC   Collection Time: 04/29/21 12:59 AM  Result Value Ref Range   WBC 9.0 4.0 - 10.5 K/uL   RBC 3.42 (L) 3.87 - 5.11 MIL/uL   Hemoglobin 10.9 (L) 12.0 - 15.0 g/dL   HCT 34.7 (L) 42.5 - 95.6 %   MCV 94.2 80.0 - 100.0 fL   MCH 31.9 26.0 - 34.0 pg    MCHC 33.9 30.0 - 36.0 g/dL   RDW 38.7 56.4 - 33.2 %   Platelets 277 150 - 400 K/uL   nRBC 0.0 0.0 - 0.2 %    Patient Active Problem List   Diagnosis Date Noted   IUGR (intrauterine growth restriction) affecting care of mother, third trimester, not applicable or unspecified fetus 04/29/2021   Atypical squamous cells cannot exclude high grade squamous intraepithelial lesion on cytologic smear of cervix (ASC-H) 11/23/2020   Migraine with aura and without status migrainosus, not intractable 11/14/2020  Supervision of high risk pregnancy, antepartum 10/31/2020   History of preterm delivery, currently pregnant 10/31/2020   UTI (urinary tract infection) during pregnancy     Assessment/Plan:  LILINOE ACKLIN is a 31 y.o. [id number] at [redacted]w[redacted]d here for IOL d/t FGR   #Labor: Low dose Pitocin started. Titrate prn per unit policy. AROM when appropriate #Pain: Epidural prn #FWB: Category 1 #ID: GBS negative #MOF: Bottle #MOC: Depo #Circ: N/A  Brand Males, CNM  04/29/2021, 2:44 AM

## 2021-04-29 NOTE — Progress Notes (Signed)
Labor Progress Note Misty Ewing is a 31 y.o. [id number] at [redacted]w[redacted]d presented for IOL for FGR S: Patient is resting comfortably s/p epidural.  O:  BP (!) 96/58    Pulse 80    Temp 97.9 F (36.6 C) (Oral)    Resp 16    LMP 08/11/2020 (Within Weeks)    SpO2 99%  EFM: Baseline FHR 130/moderate variability/ + accelerations, no decels  CVE: Dilation: 4.5 Effacement (%): 60 Station: -2 Presentation: Vertex Exam by:: MD Pray  A&P: 31 y.o. [id number] [redacted]w[redacted]d here for IOL due to FGR #Labor: AROM performed in room with clear/mildly blood tinged fluid #Pain: Epidural in place, effective #FWB: Cat 1 #GBS negative  Gerrit Heck, [place] for [place] 9:42 AM

## 2021-04-29 NOTE — Anesthesia Procedure Notes (Addendum)
Epidural Patient location during procedure: OB Start time: 04/29/2021 7:45 AM End time: 04/29/2021 7:50 AM  Staffing Anesthesiologist: Leilani Able, MD  Preanesthetic Checklist Completed: patient identified, IV checked, site marked, risks and benefits discussed, surgical consent, monitors and equipment checked, pre-op evaluation and timeout performed  Epidural Patient position: sitting Prep: DuraPrep and site prepped and draped Patient monitoring: continuous pulse ox and blood pressure Approach: midline Location: L3-L4 Injection technique: LOR air  Needle:  Needle type: Tuohy  Needle gauge: 17 G Needle length: 9 cm and 9 Needle insertion depth: 5 cm Catheter type: closed end flexible Catheter size: 19 Gauge Catheter at skin depth: 10 cm Test dose: negative and Other  Assessment Events: blood not aspirated, injection not painful, no injection resistance, no paresthesia and negative IV test  Additional Notes Reason for block:procedure for pain

## 2021-04-29 NOTE — Anesthesia Postprocedure Evaluation (Signed)
Anesthesia Post Note  Patient: Misty Ewing  Procedure(s) Performed: AN AD HOC LABOR EPIDURAL     Patient location during evaluation: Mother Baby Anesthesia Type: Epidural Level of consciousness: awake and alert Pain management: pain level controlled Vital Signs Assessment: post-procedure vital signs reviewed and stable Respiratory status: spontaneous breathing, nonlabored ventilation and respiratory function stable Cardiovascular status: stable Postop Assessment: no headache, no backache and epidural receding Anesthetic complications: no   No notable events documented.  Last Vitals:  Vitals:   04/29/21 1436 04/29/21 1933  BP: 99/61 (!) 93/55  Pulse: 68 63  Resp: 17 18  Temp: 36.9 C 36.8 C  SpO2: 100% 98%    Last Pain:  Vitals:   04/29/21 1933  TempSrc: Oral  PainSc: 0-No pain   Pain Goal:                   [employer]

## 2021-04-29 NOTE — Discharge Summary (Signed)
Postpartum Discharge Summary   Patient Name: Misty Ewing DOB: 08-11-1990 MRN: [id number]  Date of admission: 04/28/2021 Delivery date:04/29/2021  Delivering provider: PRAY, Joycelyn Schmid E  Date of discharge: 04/30/2021  Admitting diagnosis: IUGR (intrauterine growth restriction) affecting care of mother, third trimester, not applicable or unspecified fetus [O36.5930] Intrauterine pregnancy: [redacted]w[redacted]d    Secondary diagnosis:  Principal Problem:   SVD (spontaneous vaginal delivery) Active Problems:   Supervision of high risk pregnancy, antepartum   History of preterm delivery, currently pregnant   IUGR (intrauterine growth restriction) affecting care of mother, third trimester, not applicable or unspecified fetus  Additional problems: None     Discharge diagnosis: Term Pregnancy Delivered and IUGR                                               Post partum procedures: None Augmentation: AROM and Pitocin Complications: None  Hospital course: Induction of Labor With Vaginal Delivery   31y.o. yo [id number] admitted to the hospital 04/28/2021 for induction of labor.  Indication for induction:  IUGR .  Patient had an uncomplicated labor course as follows: Membrane Rupture Time/Date: 9:22 AM ,04/29/2021   Delivery Method:Vaginal, Spontaneous  Episiotomy: None  none Lacerations:  None  none Details of delivery can be found in separate delivery note.  Patient had a routine postpartum course.  She is eating, drinking, ambulating, passing flatus, and voiding without issue.  Patient is discharged home 04/30/21.  Newborn Data: Birth date:04/29/2021  Birth time:10:59 AM  Gender:Female  Living status:Living  Apgars:8 ,9  Weight:2265 g   Magnesium Sulfate received: No BMZ received: No Rhophylac: N/A MMR: N/A T-DaP: Given prenatally Flu: N/A Transfusion: No  Physical exam  Vitals:   04/29/21 1436 04/29/21 1933 04/30/21 0001 04/30/21 0355  BP: 99/61 (!) 93/55 (!) 96/57  98/60  Pulse: 68 63 66 65  Resp: _0 Temp: 98.5 F (36.9 C) 98.3 F (36.8 C) 98.1 F (36.7 C) 97.9 F (36.6 C)  TempSrc: Oral Oral Oral   SpO2: 100% 98% 98% 98%   General: alert, cooperative, and no distress Lochia: appropriate Uterine Fundus: firm and below umbilicus  DVT Evaluation: no LE edema or calf tenderness to palpation   Labs: Lab Results  Component Value Date   WBC 9.0 04/29/2021   HGB 10.9 (L) 04/29/2021   HCT 32.2 (L) 04/29/2021   MCV 94.2 04/29/2021   PLT 277 04/29/2021   CMP Latest Ref Rng & Units 11/27/2018  Glucose 70 - 99 mg/dL 80  BUN 6 - 20 mg/dL 11  Creatinine 0.44 - 1.00 mg/dL 0.81  Sodium 135 - 145 mmol/L 136  Potassium 3.5 - 5.1 mmol/L 3.9  Chloride 98 - 111 mmol/L 107  CO2 22 - 32 mmol/L 22  Calcium 8.9 - 10.3 mg/dL 8.9  Total Protein 6.5 - 8.1 g/dL -  Total Bilirubin 0.3 - 1.2 mg/dL -  Alkaline Phos 38 - 126 U/L -  AST 15 - 41 U/L -  ALT 14 - 54 U/L -   Edinburgh Score: Edinburgh Postnatal Depression Scale Screening Tool 04/29/2021  I have been able to laugh and see the funny side of things. 0  I have looked forward with enjoyment to things. 0  I have blamed myself unnecessarily when things went wrong. 1  I have been  anxious or worried for no good reason. 0  I have felt scared or panicky for no good reason. 0  Things have been getting on top of me. 0  I have been so unhappy that I have had difficulty sleeping. 0  I have felt sad or miserable. 0  I have been so unhappy that I have been crying. 0  The thought of harming myself has occurred to me. 0  Edinburgh Postnatal Depression Scale Total 1     After visit meds:  Allergies as of 04/30/2021       Reactions   Bee Venom Swelling   Unknown type of swelling   Penicillins Swelling, Rash   Has patient had a PCN reaction causing immediate rash, facial/tongue/throat swelling, SOB or lightheadedness with hypotension: No Has patient had a PCN reaction causing severe rash involving  mucus membranes or skin necrosis: No Has patient had a PCN reaction that required hospitalization No Has patient had a PCN reaction occurring within the last 10 years: No (childhood reaction) If all of the above answers are "NO", then may proceed with Cephalosporin use.        Medication List     TAKE these medications    acetaminophen 500 MG tablet Commonly known as: TYLENOL Take 2 tablets (1,000 mg total) by mouth every 8 (eight) hours as needed (pain). What changed:  medication strength how much to take when to take this reasons to take this   ibuprofen 600 MG tablet Commonly known as: ADVIL Take 1 tablet (600 mg total) by mouth every 6 (six) hours as needed (pain).   PrePLUS 27-1 MG Tabs Take 1 tablet by mouth daily.         Discharge home in stable condition Infant Feeding: Bottle Infant Disposition: home with mother Discharge instruction: per After Visit Summary and Postpartum booklet. Activity: Advance as tolerated. Pelvic rest for 6 weeks.  Diet: routine diet Future Appointments: Future Appointments  Date Time Provider [place]  06/10/2021  2:15 PM Danielle Rankin [facility]   Follow up Visit:  [place] for [place] at [facility] Follow up.   Specialty: Obstetrics and Gynecology Contact information: [place] [address] [phone]                Message sent to schedulers on 04/29/21.  Please schedule this patient for a In person postpartum visit in 6 weeks with the following provider: Any provider. Additional Postpartum F/U: None   High risk pregnancy complicated by:  IUGR Delivery mode:  Vaginal, Spontaneous  Anticipated Birth Control:  Depo  04/30/2021 Genia Del, MD

## 2021-04-29 NOTE — Progress Notes (Addendum)
Labor Progress Note Misty Ewing is a 31 y.o. [id number] at [redacted]w[redacted]d presented for IOL due to FGR.  S: Feeling more frequent, painful contractions. Desiring epidural.  O:  BP 93/62    Pulse 72    Temp 97.7 F (36.5 C) (Oral)    Resp 16    LMP 08/11/2020 (Within Weeks)  EFM: Baseline FHR 120 bpm/moderate variability/+accels, no decels  CVE: Dilation: 4.5 Effacement (%): 50 Station: -2 Presentation: Vertex Exam by:: Lysle Dingwall, CNM   A&P: 32 y.o. [id number] [redacted]w[redacted]d here for IOL due to FGR.  #Labor: Progressing well. Will continue to uptitrate pitocin as tolerated. Plan for AROM once epidural placed.  #Pain: Planning for epidural #FWB: Category 1 #GBS negative  Myrna Blazer, DO PGY-1 7:18 AM   Attestation of CNM Supervision of Resident: Evaluation and management procedures were performed by the [facility] Resident under my supervision. I was immediately available for direct supervision, assistance and direction throughout this encounter.  I also confirm that I have verified the information documented in the residents note, and that I have also personally reperformed the pertinent components of the physical exam and all of the medical decision making activities.  I have also made any necessary editorial changes.  Brand Males, CNM 04/29/2021 7:53 AM

## 2021-04-29 NOTE — Anesthesia Preprocedure Evaluation (Signed)
Anesthesia Evaluation  Patient identified by MRN, date of birth, ID band Patient awake    Reviewed: Allergy & Precautions, H&P , Patient's Chart, lab work & pertinent test results  Airway Mallampati: I   Neck ROM: full    Dental no notable dental hx.    Pulmonary neg pulmonary ROS, former smoker,    Pulmonary exam normal        Cardiovascular negative cardio ROS Normal cardiovascular exam     Neuro/Psych negative neurological ROS     GI/Hepatic negative GI ROS, Neg liver ROS,   Endo/Other  negative endocrine ROS  Renal/GU negative Renal ROS     Musculoskeletal negative musculoskeletal ROS (+)   Abdominal Normal abdominal exam  (+)   Peds  Hematology negative hematology ROS (+)   Anesthesia Other Findings   Reproductive/Obstetrics (+) Pregnancy                             Anesthesia Physical Anesthesia Plan  ASA: 2  Anesthesia Plan: Epidural   Post-op Pain Management:    Induction:   PONV Risk Score and Plan:   Airway Management Planned:   Additional Equipment:   Intra-op Plan:   Post-operative Plan:   Informed Consent: I have reviewed the patients History and Physical, chart, labs and discussed the procedure including the risks, benefits and alternatives for the proposed anesthesia with the patient or authorized representative who has indicated his/her understanding and acceptance.       Plan Discussed with:   Anesthesia Plan Comments:         Anesthesia Quick Evaluation

## 2021-04-30 MED ORDER — IBUPROFEN 600 MG PO TABS: 600.0000 mg | ORAL_TABLET | Freq: Four times a day (QID) | ORAL | 0 refills | Status: AC | PRN

## 2021-04-30 MED ORDER — ACETAMINOPHEN 500 MG PO TABS: 1000.0000 mg | ORAL_TABLET | Freq: Three times a day (TID) | ORAL | 0 refills | Status: DC | PRN

## 2021-04-30 NOTE — Social Work (Signed)
CSW received consult for hx of Anxiety and Depression.  CSW met with MOB to offer support and complete assessment.     CSW introduced self and role. CSW observed FOB Belenda Cruise present bedside and MOB holding infant Sa'raya. CSW was provided permission to speak with MOB alone. CSW informed MOB of the reason for consult. MOB was open and engaged with CSW. MOB stated she is currently doing well, however the pregnancy was "rocky." MOB shared she experienced a lot of anxiety during the pregnancy due to concerns regarding infant's weight. MOB stated she experienced the symptoms all throughout the pregnancy. MOB reported that around the time she found out the gender she went into a "deep depression." MOB stated she would stay to herself and lock herself in the room. CSW asked MOB how she coped with the feelings. MOB reported she is not open to medication or therapy. MOB reported she would sleep and write poems. CSW discussed the importance of developing positive coping skills and provided examples. MOB stated she received the diagnosis in 2013 and reported she is currently not experiencing symptoms now that she has given birth to infant. MOB shared she is relieved. MOB expressed she only experiences symptom during pregnancy, stating that she also experienced depression, which may have been postpartum in 2014. MOB declined resources but agreed to notify her doctor if needs arise. MOB identified FOB and her older children as supports. MOB denies any current SI, HI or DV.   CSW provided education regarding perinatal mood disorders versus the baby blues period  and discussed treatment.  CSW recommended self-evaluation using the New Mom Checklist.   CSW provided review of Sudden Infant Death Syndrome (SIDS) precautions. MOB stated she has a bassinet, car seat and all essentials. MOB identified [facility] for follow-up care and denies any barriers to care. MOB  receives food stamp and WIC benefits. MOB  stated all basic needs are being met. MOB declined any agency referrals and reports no needs at this time.   CSW identifies no further need for intervention and no barriers to discharge at this time.  Darra Lis, [place] [employer] [phone]

## 2021-04-30 NOTE — Progress Notes (Signed)
Pt declined d/c at this time

## 2021-05-01 NOTE — Discharge Summary (Signed)
Postpartum Discharge Summary   Patient Name: Misty Ewing DOB: [birth year] MRN: [id number]  Date of admission: 04/28/2021 Delivery date:04/29/2021  Delivering provider: PRAY, Joycelyn Schmid E  Date of discharge: 05/01/2021  Admitting diagnosis: IUGR (intrauterine growth restriction) affecting care of mother, third trimester, not applicable or unspecified fetus [O36.5930] Intrauterine pregnancy: [redacted]w[redacted]d    Secondary diagnosis:  Principal Problem:   SVD (spontaneous vaginal delivery) Active Problems:   Supervision of high risk pregnancy, antepartum   History of preterm delivery, currently pregnant   IUGR (intrauterine growth restriction) affecting care of mother, third trimester, not applicable or unspecified fetus  Additional problems: None     Discharge diagnosis: Term Pregnancy Delivered and IUGR                                               Post partum procedures: None Augmentation: AROM and Pitocin Complications: None  Hospital course: Induction of Labor With Vaginal Delivery   31y.o. yo [id number] admitted to the hospital 04/28/2021 for induction of labor.  Indication for induction:  IUGR .  Patient had an uncomplicated labor course as follows: Membrane Rupture Time/Date: 9:22 AM ,04/29/2021   Delivery Method:Vaginal, Spontaneous  Episiotomy: None  Lacerations:  None  Details of delivery can be found in separate delivery note.  Patient had a routine postpartum course.  She is eating, drinking, ambulating, passing flatus, and voiding without issue.  She is formula feeding well.  Patient is discharged home 05/01/21.  Newborn Data: Birth date:04/29/2021  Birth time:10:59 AM  Gender:Female  Living status:Living  Apgars:8 ,9  Weight:2265 g   Magnesium Sulfate received: No BMZ received: No Rhophylac: N/A MMR: N/A T-DaP: Given prenatally Flu: N/A Transfusion: No  Physical exam  Vitals:   04/30/21 0355 04/30/21 1543 04/30/21 2119 05/01/21 0520  BP: 98/60 (!)  94/58 104/64 100/61  Pulse: 65 63 77 61  Resp: _0 Temp: 97.9 F (36.6 C) 98.4 F (36.9 C) 98.7 F (37.1 C) 98 F (36.7 C)  TempSrc:  Oral Oral Oral  SpO2: 98%  98%    General: alert, cooperative, and no distress Lochia: appropriate Uterine Fundus: firm and below umbilicus, nontender to palpation DVT Evaluation: no LE edema or calf tenderness to palpation   Labs: Lab Results  Component Value Date   WBC 9.0 04/29/2021   HGB 10.9 (L) 04/29/2021   HCT 32.2 (L) 04/29/2021   MCV 94.2 04/29/2021   PLT 277 04/29/2021   CMP Latest Ref Rng & Units 11/27/2018  Glucose 70 - 99 mg/dL 80  BUN 6 - 20 mg/dL 11  Creatinine 0.44 - 1.00 mg/dL 0.81  Sodium 135 - 145 mmol/L 136  Potassium 3.5 - 5.1 mmol/L 3.9  Chloride 98 - 111 mmol/L 107  CO2 22 - 32 mmol/L 22  Calcium 8.9 - 10.3 mg/dL 8.9  Total Protein 6.5 - 8.1 g/dL -  Total Bilirubin 0.3 - 1.2 mg/dL -  Alkaline Phos 38 - 126 U/L -  AST 15 - 41 U/L -  ALT 14 - 54 U/L -   Edinburgh Score: Edinburgh Postnatal Depression Scale Screening Tool 04/29/2021  I have been able to laugh and see the funny side of things. 0  I have looked forward with enjoyment to things. 0  I have blamed myself unnecessarily when things went wrong.  1  I have been anxious or worried for no good reason. 0  I have felt scared or panicky for no good reason. 0  Things have been getting on top of me. 0  I have been so unhappy that I have had difficulty sleeping. 0  I have felt sad or miserable. 0  I have been so unhappy that I have been crying. 0  The thought of harming myself has occurred to me. 0  Edinburgh Postnatal Depression Scale Total 1     After visit meds:  Allergies as of 05/01/2021       Reactions   Bee Venom Swelling   Unknown type of swelling   Penicillins Swelling, Rash   Has patient had a PCN reaction causing immediate rash, facial/tongue/throat swelling, SOB or lightheadedness with hypotension: No Has patient had a PCN reaction  causing severe rash involving mucus membranes or skin necrosis: No Has patient had a PCN reaction that required hospitalization No Has patient had a PCN reaction occurring within the last 10 years: No (childhood reaction) If all of the above answers are "NO", then may proceed with Cephalosporin use.        Medication List     TAKE these medications    acetaminophen 500 MG tablet Commonly known as: TYLENOL Take 2 tablets (1,000 mg total) by mouth every 8 (eight) hours as needed (pain). What changed:  medication strength how much to take when to take this reasons to take this   ibuprofen 600 MG tablet Commonly known as: ADVIL Take 1 tablet (600 mg total) by mouth every 6 (six) hours as needed (pain).   PrePLUS 27-1 MG Tabs Take 1 tablet by mouth daily.         Discharge home in stable condition Infant Feeding: Bottle Infant Disposition: home with mother Discharge instruction: per After Visit Summary and Postpartum booklet. Activity: Advance as tolerated. Pelvic rest for 6 weeks.  Diet: routine diet Future Appointments: Future Appointments  Date Time Provider [place]  06/10/2021  2:15 PM Danielle Rankin [facility]   Follow up Visit:  [place] for [place] at [facility] Follow up.   Specialty: Obstetrics and Gynecology Contact information: [place] [phone]                Message sent to schedulers on 04/29/21.  Please schedule this patient for a In person postpartum visit in 6 weeks with the following provider: Any provider. Additional Postpartum F/U: None   High risk pregnancy complicated by:  IUGR Delivery mode:  Vaginal, Spontaneous  Anticipated Birth Control:  Depo given prior to discharge  05/01/2021 Genia Del, MD

## 2021-05-09 ENCOUNTER — Telehealth (HOSPITAL_COMMUNITY): Payer: Self-pay

## 2021-05-09 NOTE — Telephone Encounter (Signed)
"  I'm good. My lower stomach has been hurting." RN reviewed uterine involution and postpartum cramping. "They prescribed me ibuprofen but it makes me sleepy so I don't take it often." RN told patient to reach out to her OB about concerns about lower abdominal pain. RN encouraged patient to try a heating pad. RN also explained the importance of keeping an empty bladder to help minimize cramping. RN explained to patient that sometimes cramping can be more with each baby you have. RN also told patient that cramping can be more while breastfeeding but patient states that she is not breastfeeding. Patient declines any other concerns or questions. ? ?"She's doing good. Eating and gaining weight well. She sleeps in a bassinet." RN reviewed ABC's of safe sleep with patient. Patient declines any questions or concerns about baby. ? ?EPDS score is 2. ? Heron Nay ?05/09/2021,1643 ?

## 2021-05-20 ENCOUNTER — Inpatient Hospital Stay (HOSPITAL_COMMUNITY): Admission: AD | Admit: 2021-05-20 | Payer: Medicaid Other | Source: Home / Self Care

## 2021-06-10 ENCOUNTER — Other Ambulatory Visit: Payer: Self-pay | Admitting: Obstetrics & Gynecology

## 2021-06-10 ENCOUNTER — Encounter: Payer: Self-pay | Admitting: Family Medicine

## 2021-06-10 ENCOUNTER — Encounter: Payer: Self-pay | Admitting: Medical

## 2021-06-10 ENCOUNTER — Ambulatory Visit (INDEPENDENT_AMBULATORY_CARE_PROVIDER_SITE_OTHER): Payer: Medicaid Other | Admitting: Medical

## 2021-06-10 ENCOUNTER — Ambulatory Visit: Payer: Medicaid Other | Admitting: Medical

## 2021-06-10 ENCOUNTER — Ambulatory Visit: Payer: Self-pay | Admitting: Medical

## 2021-06-10 ENCOUNTER — Ambulatory Visit: Payer: Medicaid Other | Admitting: Obstetrics and Gynecology

## 2021-06-10 VITALS — BP 103/69 | HR 94 | Wt 119.9 lb

## 2021-06-10 DIAGNOSIS — M542 Cervicalgia: Secondary | ICD-10-CM | POA: Diagnosis not present

## 2021-06-10 DIAGNOSIS — O210 Mild hyperemesis gravidarum: Secondary | ICD-10-CM

## 2021-06-10 DIAGNOSIS — Z5941 Food insecurity: Secondary | ICD-10-CM | POA: Diagnosis not present

## 2021-06-10 MED ORDER — CYCLOBENZAPRINE HCL 10 MG PO TABS: 10.0000 mg | ORAL_TABLET | Freq: Two times a day (BID) | ORAL | 0 refills | Status: AC | PRN

## 2021-06-10 NOTE — Progress Notes (Signed)
? ? ?Post Partum Visit Note ? ?Misty Ewing is a 31 y.o. [phone] female who presents for a postpartum visit. She is 6 weeks postpartum following a normal spontaneous vaginal delivery.  I have fully reviewed the prenatal and intrapartum course. The delivery was at 37 gestational weeks.  Anesthesia: epidural. Postpartum course has been going well per patient. Baby is doing well per patient. Baby is feeding by bottle - Similac Neosure. Bleeding moderate lochia. Bowel function is normal. Bladder function is normal. Patient is not sexually active. Contraception method is Depo-Provera injections. Postpartum depression screening: negative. ? ? ?The pregnancy intention screening data noted above was reviewed. Potential methods of contraception were discussed. The patient elected to proceed with No data recorded. ? ? Edinburgh Postnatal Depression Scale - 06/10/21 1325   ? ?  ? Edinburgh Postnatal Depression Scale:  In the Past 7 Days  ? I have been able to laugh and see the funny side of things. 0   ? I have looked forward with enjoyment to things. 0   ? I have blamed myself unnecessarily when things went wrong. 0   ? I have been anxious or worried for no good reason. 0   ? I have felt scared or panicky for no good reason. 0   ? Things have been getting on top of me. 1   ? I have been so unhappy that I have had difficulty sleeping. 0   ? I have felt sad or miserable. 1   ? I have been so unhappy that I have been crying. 0   ? The thought of harming myself has occurred to me. 0   ? Edinburgh Postnatal Depression Scale Total 2   ? ?  ?  ? ?  ? ? ?Health Maintenance Due  ?Topic Date Due  ? COVID-19 Vaccine (1) Never done  ? ? ?The following portions of the patient's history were reviewed and updated as appropriate: allergies, current medications, past family history, past medical history, past social history, past surgical history, and problem list. ? ?Review of Systems ?Pertinent items are noted in HPI. ? ?Objective:   ?BP 103/69   Pulse 94   Wt 119 lb 14.4 oz (54.4 kg)   LMP 08/11/2020 (Within Weeks)   Breastfeeding No   BMI 22.29 kg/m?   ? ?General:  alert and cooperative  ? Breasts:  not indicated  ?Lungs: Normal effort  ?Heart:  Normal rate  ?Abdomen: Flat, non-distended    ?Wound N/A  ?GU exam:  not indicated  ?     ?Assessment:  ? ?Normal postpartum exam ?Neck pain ?Depo provera contraceptive status  ? ?Plan:  ? ?Essential components of care per ACOG recommendations: ? ?1.  Mood and well being: Patient with negative depression screening today. Reviewed local resources for support.  ?- Patient tobacco use? Yes. Patient desires to quit? Yes.Discussed reduction and cessation  ?- hx of drug use? No.   ? ?2. Infant care and feeding:  ?-Patient currently breastmilk feeding? No.  ?-Social determinants of health (SDOH) reviewed in EPIC. No concerns ? ?3. Sexuality, contraception and birth spacing ?- Patient does not want a pregnancy in the next year.  Desired family size is 5 children.  ?- Reviewed reproductive life planning. Reviewed contraceptive methods based on pt preferences and effectiveness.  Patient desired Hormonal Injection today.   ?- Discussed birth spacing of 18 months ? ?4. Sleep and fatigue ?-Encouraged family/partner/community support of 4 hrs of uninterrupted sleep to help  with mood and fatigue ? ?5. Physical Recovery  ?- Discussed patients delivery and complications. She describes her labor as good. ?- Patient had a Vaginal, no problems at delivery. Patient had no lacerations. Perineal healing reviewed. Patient expressed understanding ?- Patient has urinary incontinence? No. ?- Patient is safe to resume physical and sexual activity ? ?6.  Health Maintenance ?- HM due items addressed Yes ?- Last pap smear  ?Diagnosis  ?Date Value Ref Range Status  ?11/14/2020 (A)  Final  ? - Atypical squamous cells, cannot exclude high grade squamous  ?11/14/2020 intraepithelial lesion (ASC-H) (A)  Final  ? Pap smear not done  at today's visit.  ?-Breast Cancer screening indicated? No.  ? ?7. Chronic Disease/Pregnancy Condition follow up: None ? ?8. Neck Pain ?- Rx flexeril given, advised of short course for acute pain, with no plan to refill ?- Contact for chiropractor given  ?- Ice/heat recommended  ? ?Vonzella Nipple, PA-C ?Center for [employer], [facility]  ?

## 2021-06-10 NOTE — Progress Notes (Deleted)
? ? ?  Post Partum Visit Note ? ?Misty Ewing is a 31 y.o. [phone] female who presents for a postpartum visit. She is 6 weeks postpartum following a normal spontaneous vaginal delivery.  I have fully reviewed the prenatal and intrapartum course. The delivery was at 37 gestational weeks.  Anesthesia: epidural. Postpartum course has been ***. Baby is doing well***. Baby is feeding by {breastmilk/bottle:69}. Bleeding {vag bleed:12292}. Bowel function is {normal:32111}. Bladder function is {normal:32111}. Patient {is/is not:9024} sexually active. Contraception method is {contraceptive method:5051}. Postpartum depression screening: {gen negative/positive:315881}. ? ? ?The pregnancy intention screening data noted above was reviewed. Potential methods of contraception were discussed. The patient elected to proceed with No data recorded. ? ? ? ?Health Maintenance Due  ?Topic Date Due  ? COVID-19 Vaccine (1) Never done  ? ? ?{Common ambulatory SmartLinks:19316} ? ?Review of Systems ?{ros; complete:30496} ? ?Objective:  ?LMP 08/11/2020 (Within Weeks)   ? ?General:  {gen appearance:16600}  ? Breasts:  {desc; normal/abnormal/not indicated:14647}  ?Lungs: {lung exam:16931}  ?Heart:  {heart exam:5510}  ?Abdomen: {abdomen exam:16834}   ?Wound {Wound assessment:11097}  ?GU exam:  {desc; normal/abnormal/not indicated:14647}  ?     ?Assessment:  ? ? There are no diagnoses linked to this encounter. ? ?*** postpartum exam.  ? ?Plan:  ? ?Essential components of care per ACOG recommendations: ? ?1.  Mood and well being: Patient with {gen negative/positive:315881} depression screening today. Reviewed local resources for support.  ?- Patient tobacco use? {tobacco use:25506}  ?- hx of drug use? {yes/no:25505}   ? ?2. Infant care and feeding:  ?-Patient currently breastmilk feeding? {yes/no:25502}  ?-Social determinants of health (SDOH) reviewed in EPIC. No concerns***The following needs were identified*** ? ?3. Sexuality,  contraception and birth spacing ?- Patient {DOES_DOES DGL:87564} want a pregnancy in the next year.  Desired family size is {NUMBER 1-10:22536} children.  ?- Reviewed reproductive life planning. Reviewed contraceptive methods based on pt preferences and effectiveness.  Patient desired {Upstream End Methods:24109} today.   ?- Discussed birth spacing of 18 months ? ?4. Sleep and fatigue ?-Encouraged family/partner/community support of 4 hrs of uninterrupted sleep to help with mood and fatigue ? ?5. Physical Recovery  ?- Discussed patients delivery and complications. She describes her labor as {description:25511} ?- Patient had a {CHL AMB DELIVERY:[phone]}. Patient had a {laceration:25518} laceration. Perineal healing reviewed. Patient expressed understanding ?- Patient has urinary incontinence? {yes/no:25515} ?- Patient {ACTION; IS/IS PPI:[id number]} safe to resume physical and sexual activity ? ?6.  Health Maintenance ?- HM due items addressed {Yes or If no, why not?:20788} ?- Last pap smear  ?Diagnosis  ?Date Value Ref Range Status  ?11/14/2020 (A)  Final  ? - Atypical squamous cells, cannot exclude high grade squamous  ?11/14/2020 intraepithelial lesion (ASC-H) (A)  Final  ? Pap smear {done:10129} at today's visit.  ?-Breast Cancer screening indicated? {indicated:25516} ? ?7. Chronic Disease/Pregnancy Condition follow up: {Follow up:25499} ? ?- PCP follow up ? ?Marjo Bicker, RN ?Center for [employer], [facility]  ?

## 2021-06-10 NOTE — Patient Instructions (Signed)
Misty Ewing - [facility] [address]  ?[phone] ? ? ?

## 2021-06-13 ENCOUNTER — Other Ambulatory Visit: Payer: Self-pay | Admitting: Lactation Services

## 2021-06-13 MED ORDER — PRENATAL PLUS 27-1 MG PO TABS: 1.0000 | ORAL_TABLET | Freq: Every day | ORAL | 11 refills | Status: AC

## 2021-07-16 ENCOUNTER — Ambulatory Visit (INDEPENDENT_AMBULATORY_CARE_PROVIDER_SITE_OTHER): Payer: Medicaid Other

## 2021-07-16 VITALS — BP 100/66 | HR 96 | Wt 123.9 lb

## 2021-07-16 DIAGNOSIS — Z3042 Encounter for surveillance of injectable contraceptive: Secondary | ICD-10-CM

## 2021-07-16 MED ORDER — MEDROXYPROGESTERONE ACETATE 150 MG/ML IM SUSP
150.0000 mg | Freq: Once | INTRAMUSCULAR | Status: AC
  Administered 2021-07-16: 150 mg via INTRAMUSCULAR

## 2021-07-16 NOTE — Progress Notes (Signed)
Glorious Peach here for Depo-Provera Injection. Injection administered without complication. Patient will return in 3 months for next injection between 10/01/21 and 10/15/21. Next annual visit due April 2024.  ? ?Marjo Bicker, RN ?07/16/2021  8:07 AM ? ?

## 2021-10-01 ENCOUNTER — Ambulatory Visit: Payer: Medicaid Other

## 2022-09-05 IMAGING — US US MFM FETAL BPP W/ NON-STRESS
1 series · 12 of 28 positions shown · non-contrast
Comparison: none

[Series 1: us mfm fetal bpp w/ non-stress · 32 acquisitions, 12 frames shown]
[im 2/32]
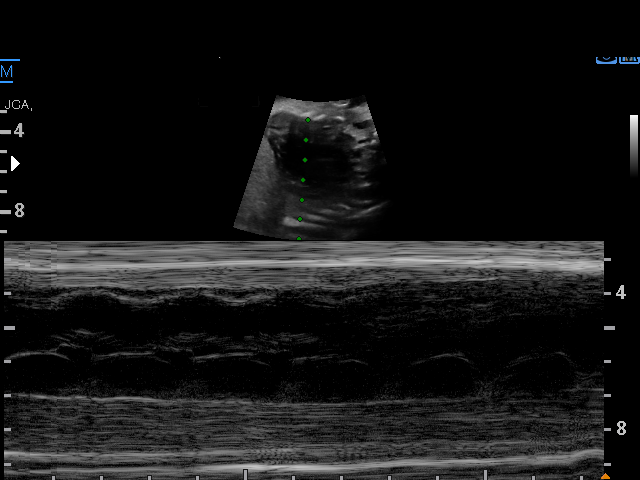
[im 4/32]
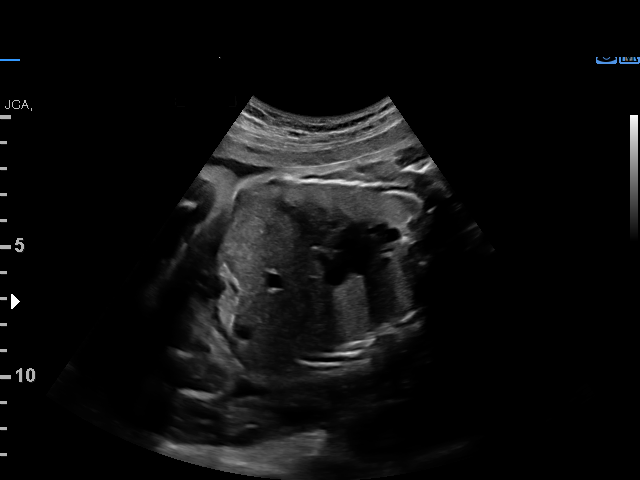
[im 6/32]
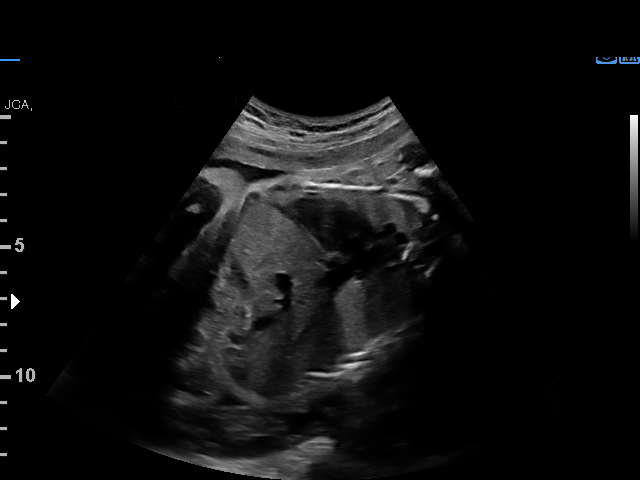
[im 10/32]
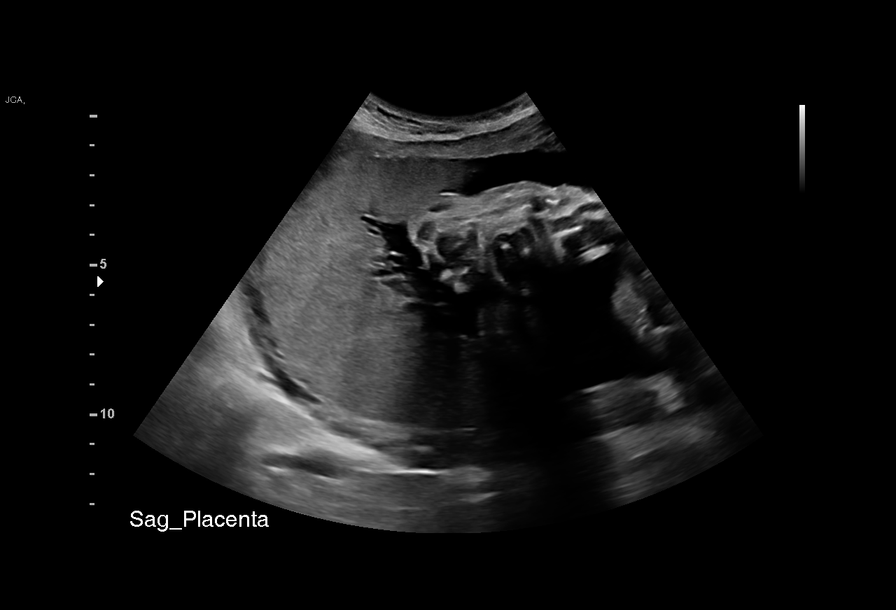
[im 12/32]
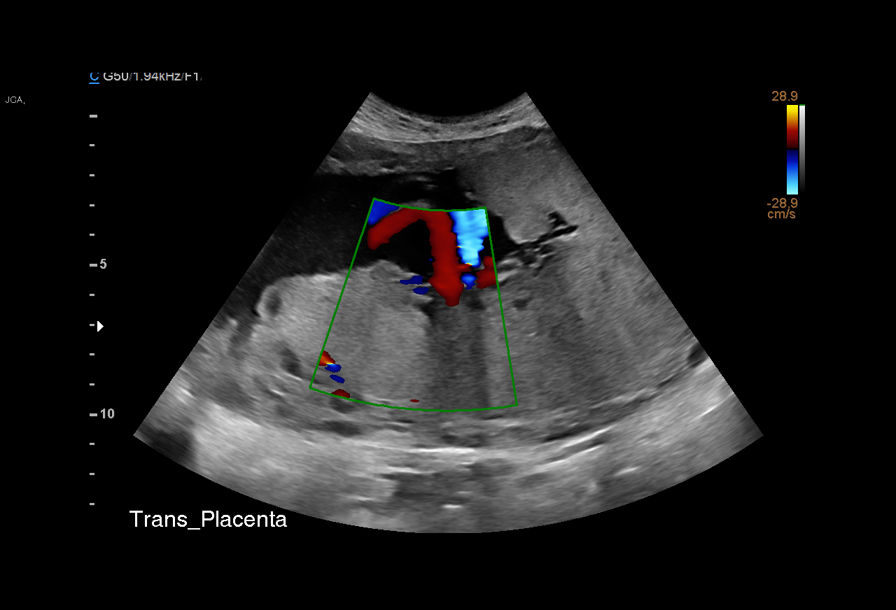
[im 14/32]
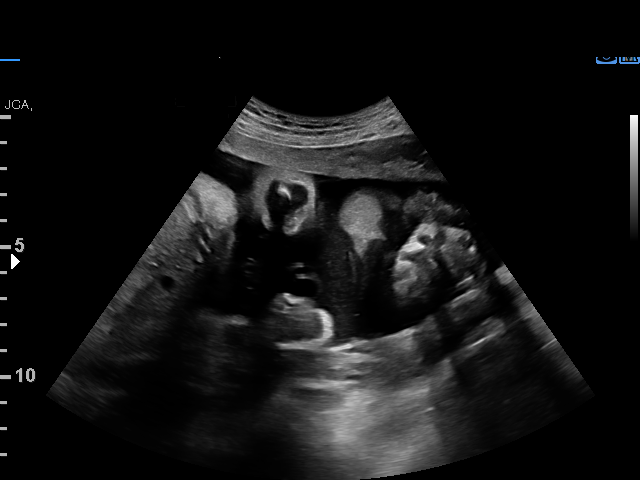
[im 18/32]
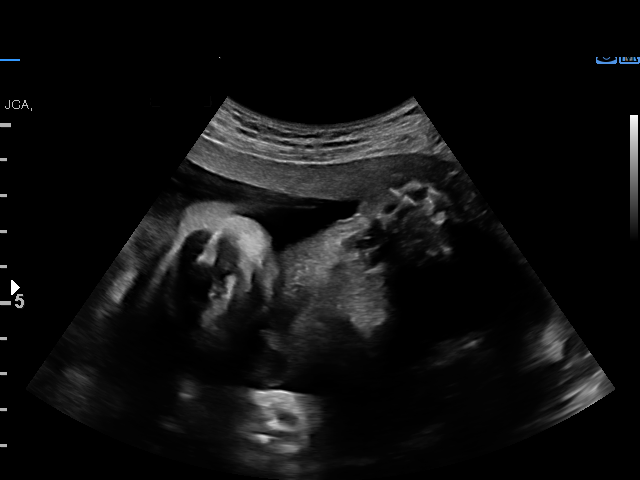
[im 20/32]
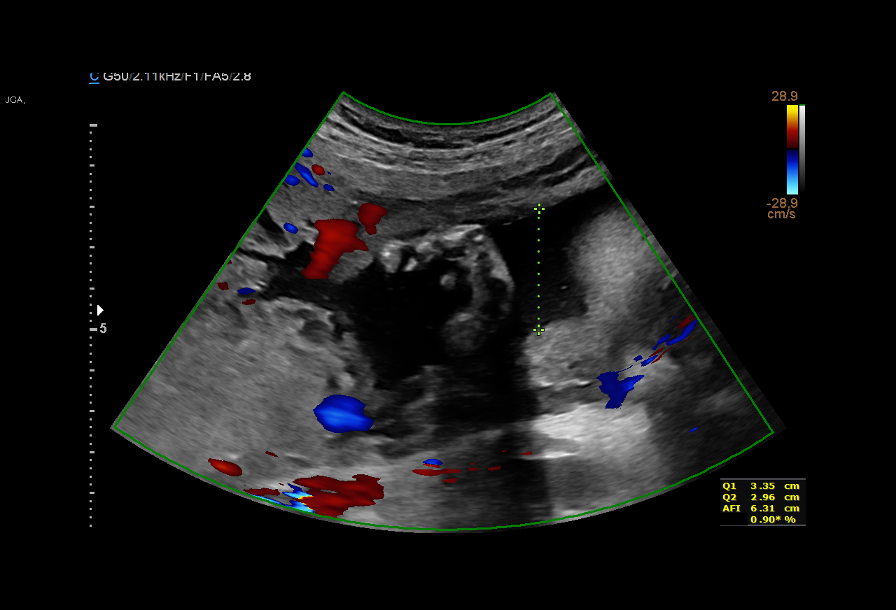
[im 22/32]
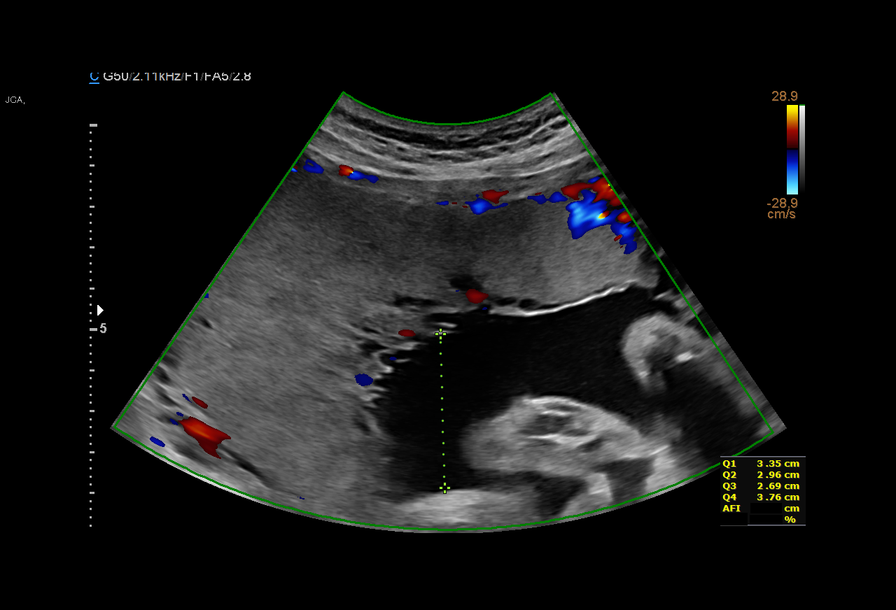
[im 26/32]
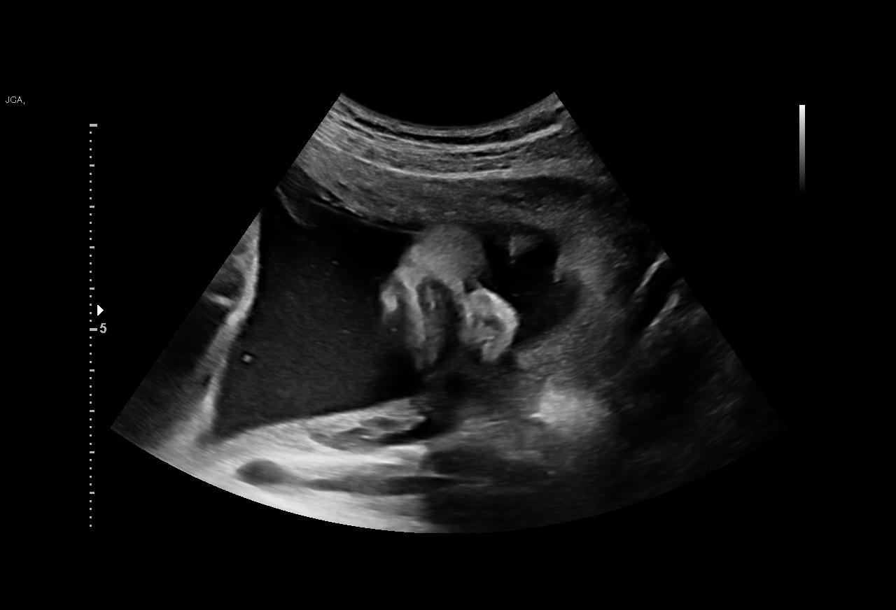
[im 28/32]
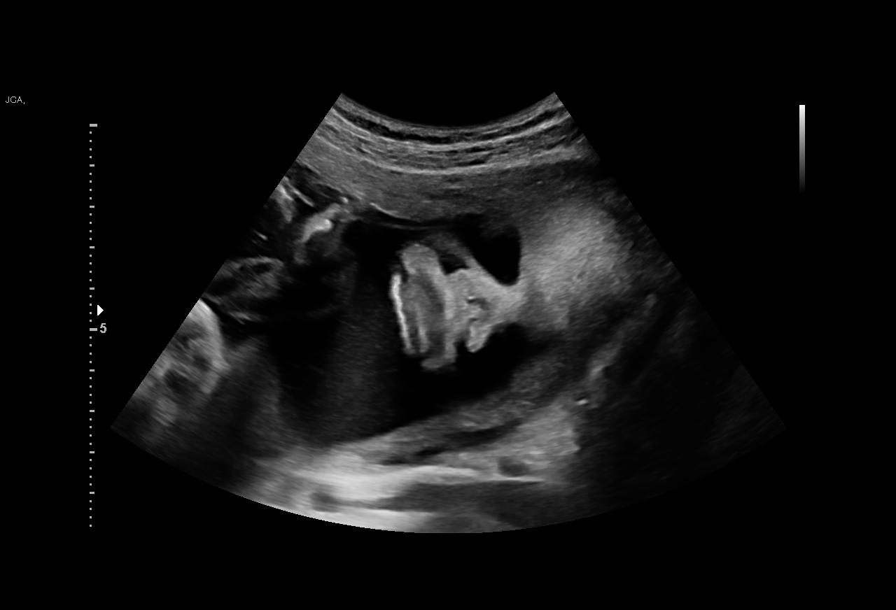
[im 30/32]
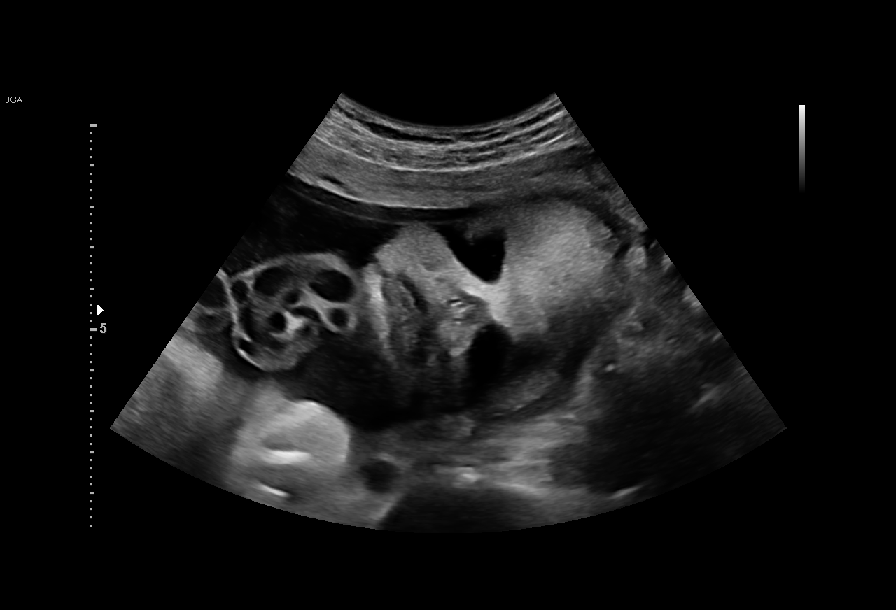

[12 of 28 positions shown; findings below may reference images not displayed]

[id number]

    W/NONSTRESS

Indications

 Maternal care for known or suspected poor
 fetal growth, third trimester, not applicable or
 unspecified IUGR
 Poor obstetric history: Previous preterm
 delivery, antepartum (35 weeks)
 Tobacco use complicating pregnancy, third
 trimester
 31 weeks gestation of pregnancy
Fetal Evaluation

 Num Of Fetuses:         1
 Fetal Heart Rate(bpm):  143
 Cardiac Activity:       Observed
 Presentation:           Cephalic
 Placenta:               Left lateral
 P. Cord Insertion:      Visualized

 Amniotic Fluid
 AFI FV:      Within normal limits

 AFI Sum(cm)     %Tile       Largest Pocket(cm)
 12.76           37
 RUQ(cm)       RLQ(cm)       LUQ(cm)        LLQ(cm)

Biophysical Evaluation

 Amniotic F.V:   Within normal limits       F. Tone:        Observed
 F. Movement:    Observed                   N.S.T:          Reactive
 F. Breathing:   Observed                   Score:          [DATE]
OB History

 Blood Type:   A+
 Gravidity:    5         Term:   3        Prem:   1        SAB:   0
 TOP:          0       Ectopic:  0        Living: 4
Gestational Age

 LMP:           31w 3d        Date:  08/11/20                 EDD:   05/18/21
 Best:          31w 3d     Det. By:  LMP  (08/11/20)          EDD:   05/18/21
Doppler - Fetal Vessels

 Umbilical Artery
  S/D     %tile      RI    %tile                             ADFV    RDFV
  3.15       74    0.68       76                                No      No

Impression

 Severe fetal growth restriction.  On ultrasound performed last
 week, the estimated fetal weight was at the 3rd percentile.

 Amniotic fluid is normal and good fetal activity is seen .
 Umbilical artery Doppler showed normal forward diastolic
 flow.  Antenatal testing is reassuring.  NST is reactive.  BPP
 [DATE].
Recommendations

 - Continue weekly BPP, NST and UA Doppler studies till
 delivery.
                 Iraheta, Kavin
